# Patient Record
Sex: Female | Born: 1955 | Hispanic: No | Smoking: Current every day smoker
Health system: Southern US, Community
[De-identification: ages and names within clinical notes are randomized; demographics above are authoritative.]

## PROBLEM LIST (undated history)

## (undated) DIAGNOSIS — J45909 Unspecified asthma, uncomplicated: Secondary | ICD-10-CM

## (undated) DIAGNOSIS — S37009A Unspecified injury of unspecified kidney, initial encounter: Secondary | ICD-10-CM

## (undated) DIAGNOSIS — N390 Urinary tract infection, site not specified: Secondary | ICD-10-CM

## (undated) DIAGNOSIS — K219 Gastro-esophageal reflux disease without esophagitis: Secondary | ICD-10-CM

## (undated) DIAGNOSIS — IMO0002 Reserved for concepts with insufficient information to code with codable children: Secondary | ICD-10-CM

## (undated) DIAGNOSIS — E119 Type 2 diabetes mellitus without complications: Secondary | ICD-10-CM

## (undated) DIAGNOSIS — I4891 Unspecified atrial fibrillation: Secondary | ICD-10-CM

## (undated) DIAGNOSIS — N19 Unspecified kidney failure: Secondary | ICD-10-CM

## (undated) DIAGNOSIS — I10 Essential (primary) hypertension: Secondary | ICD-10-CM

## (undated) DIAGNOSIS — E785 Hyperlipidemia, unspecified: Secondary | ICD-10-CM

## (undated) DIAGNOSIS — N3000 Acute cystitis without hematuria: Secondary | ICD-10-CM

## (undated) DIAGNOSIS — R700 Elevated erythrocyte sedimentation rate: Secondary | ICD-10-CM

## (undated) HISTORY — DX: Elevated erythrocyte sedimentation rate: R70.0

## (undated) HISTORY — DX: Acute cystitis without hematuria: N30.00

## (undated) HISTORY — DX: Unspecified injury of unspecified kidney, initial encounter: S37.009A

## (undated) HISTORY — PX: HYSTEROTOMY: SHX1776

## (undated) HISTORY — DX: Urinary tract infection, site not specified: N39.0

## (undated) HISTORY — DX: Gastro-esophageal reflux disease without esophagitis: K21.9

## (undated) HISTORY — PX: OTHER SURGICAL HISTORY: SHX169

## (undated) HISTORY — DX: Type 2 diabetes mellitus without complications: E11.9

## (undated) HISTORY — PX: ABDOMINAL HYSTERECTOMY: SHX81

## (undated) HISTORY — DX: Essential (primary) hypertension: I10

## (undated) HISTORY — DX: Unspecified kidney failure: N19

## (undated) HISTORY — DX: Hyperlipidemia, unspecified: E78.5

---

## 2008-04-06 ENCOUNTER — Ambulatory Visit: Payer: Self-pay

## 2008-04-22 ENCOUNTER — Ambulatory Visit: Payer: Self-pay

## 2009-04-18 DIAGNOSIS — E119 Type 2 diabetes mellitus without complications: Secondary | ICD-10-CM | POA: Insufficient documentation

## 2009-04-18 HISTORY — DX: Type 2 diabetes mellitus without complications: E11.9

## 2009-04-19 DIAGNOSIS — E785 Hyperlipidemia, unspecified: Secondary | ICD-10-CM | POA: Insufficient documentation

## 2009-04-19 HISTORY — DX: Hyperlipidemia, unspecified: E78.5

## 2011-05-08 DIAGNOSIS — E669 Obesity, unspecified: Secondary | ICD-10-CM | POA: Insufficient documentation

## 2011-09-24 ENCOUNTER — Ambulatory Visit: Payer: Self-pay

## 2011-12-05 DIAGNOSIS — Z720 Tobacco use: Secondary | ICD-10-CM | POA: Insufficient documentation

## 2012-03-11 ENCOUNTER — Ambulatory Visit: Payer: Self-pay

## 2012-09-09 ENCOUNTER — Ambulatory Visit: Payer: Self-pay | Admitting: "Endocrinology

## 2012-10-16 ENCOUNTER — Ambulatory Visit: Payer: Self-pay | Admitting: Adult Health

## 2013-08-27 LAB — HM DIABETES EYE EXAM

## 2014-05-06 ENCOUNTER — Other Ambulatory Visit: Payer: Self-pay

## 2014-05-11 ENCOUNTER — Other Ambulatory Visit: Payer: Self-pay

## 2014-05-18 ENCOUNTER — Ambulatory Visit: Payer: Self-pay

## 2014-06-24 ENCOUNTER — Ambulatory Visit: Payer: Self-pay

## 2014-07-13 ENCOUNTER — Other Ambulatory Visit: Payer: Self-pay

## 2014-07-20 ENCOUNTER — Ambulatory Visit: Payer: Self-pay

## 2014-08-05 ENCOUNTER — Emergency Department
Admission: EM | Admit: 2014-08-05 | Discharge: 2014-08-05 | Discharge status: Against Medical Advice | Payer: Self-pay | Attending: Emergency Medicine | Admitting: Emergency Medicine

## 2014-08-05 ENCOUNTER — Encounter: Payer: Self-pay | Admitting: Emergency Medicine

## 2014-08-05 ENCOUNTER — Ambulatory Visit: Payer: Self-pay

## 2014-08-05 DIAGNOSIS — E1165 Type 2 diabetes mellitus with hyperglycemia: Secondary | ICD-10-CM | POA: Insufficient documentation

## 2014-08-05 DIAGNOSIS — Z720 Tobacco use: Secondary | ICD-10-CM | POA: Insufficient documentation

## 2014-08-05 DIAGNOSIS — R6883 Chills (without fever): Secondary | ICD-10-CM | POA: Insufficient documentation

## 2014-08-05 DIAGNOSIS — R1110 Vomiting, unspecified: Secondary | ICD-10-CM | POA: Insufficient documentation

## 2014-08-05 HISTORY — DX: Type 2 diabetes mellitus without complications: E11.9

## 2014-08-05 LAB — CBC
HEMATOCRIT: 43.4 % (ref 35.0–47.0)
HEMOGLOBIN: 14.6 g/dL (ref 12.0–16.0)
MCH: 29.6 pg (ref 26.0–34.0)
MCHC: 33.7 g/dL (ref 32.0–36.0)
MCV: 87.9 fL (ref 80.0–100.0)
PLATELETS: 224 10*3/uL (ref 150–440)
RBC: 4.94 MIL/uL (ref 3.80–5.20)
RDW: 13.9 % (ref 11.5–14.5)
WBC: 15.9 10*3/uL — AB (ref 3.6–11.0)

## 2014-08-05 LAB — COMPREHENSIVE METABOLIC PANEL
ALBUMIN: 4.1 g/dL (ref 3.5–5.0)
ALT: 26 U/L (ref 14–54)
AST: 26 U/L (ref 15–41)
Alkaline Phosphatase: 139 U/L — ABNORMAL HIGH (ref 38–126)
Anion gap: 13 (ref 5–15)
BUN: 14 mg/dL (ref 6–20)
CHLORIDE: 100 mmol/L — AB (ref 101–111)
CO2: 25 mmol/L (ref 22–32)
Calcium: 9.8 mg/dL (ref 8.9–10.3)
Creatinine, Ser: 0.57 mg/dL (ref 0.44–1.00)
GFR calc Af Amer: >60 mL/min (ref >60)
Glucose, Bld: 335 mg/dL — ABNORMAL HIGH (ref 65–99)
POTASSIUM: 4.1 mmol/L (ref 3.5–5.1)
SODIUM: 138 mmol/L (ref 135–145)
TOTAL PROTEIN: 7.8 g/dL (ref 6.5–8.1)
Total Bilirubin: 0.6 mg/dL (ref 0.3–1.2)

## 2014-08-05 NOTE — ED Notes (Signed)
Pt woke up this am with chills, states she feels like she is going to faint, has been vomiting today and her blood glucose has been running high.

## 2014-08-24 ENCOUNTER — Ambulatory Visit: Payer: Self-pay

## 2014-08-26 ENCOUNTER — Ambulatory Visit: Payer: Self-pay | Admitting: Ophthalmology

## 2014-08-26 LAB — HM DIABETES EYE EXAM

## 2014-09-19 DIAGNOSIS — E871 Hypo-osmolality and hyponatremia: Secondary | ICD-10-CM | POA: Insufficient documentation

## 2014-09-19 DIAGNOSIS — S37009A Unspecified injury of unspecified kidney, initial encounter: Secondary | ICD-10-CM

## 2014-09-19 DIAGNOSIS — D72829 Elevated white blood cell count, unspecified: Secondary | ICD-10-CM | POA: Insufficient documentation

## 2014-09-19 DIAGNOSIS — N3000 Acute cystitis without hematuria: Secondary | ICD-10-CM | POA: Insufficient documentation

## 2014-09-19 DIAGNOSIS — N19 Unspecified kidney failure: Secondary | ICD-10-CM

## 2014-09-19 DIAGNOSIS — N390 Urinary tract infection, site not specified: Secondary | ICD-10-CM

## 2014-09-19 HISTORY — DX: Urinary tract infection, site not specified: N39.0

## 2014-09-19 HISTORY — DX: Acute cystitis without hematuria: N30.00

## 2014-09-19 HISTORY — DX: Unspecified injury of unspecified kidney, initial encounter: S37.009A

## 2014-09-19 HISTORY — DX: Unspecified kidney failure: N19

## 2014-09-22 DIAGNOSIS — R700 Elevated erythrocyte sedimentation rate: Secondary | ICD-10-CM

## 2014-09-22 HISTORY — DX: Elevated erythrocyte sedimentation rate: R70.0

## 2014-10-14 ENCOUNTER — Other Ambulatory Visit: Payer: Self-pay

## 2014-10-19 ENCOUNTER — Ambulatory Visit: Payer: Self-pay

## 2014-10-20 ENCOUNTER — Ambulatory Visit: Payer: Self-pay

## 2014-10-26 ENCOUNTER — Ambulatory Visit: Payer: Self-pay

## 2014-10-28 ENCOUNTER — Ambulatory Visit: Payer: Self-pay | Admitting: Ophthalmology

## 2014-10-28 LAB — HM DIABETES EYE EXAM

## 2014-11-01 ENCOUNTER — Encounter: Payer: Self-pay | Admitting: Urology

## 2014-11-01 ENCOUNTER — Ambulatory Visit
Admission: RE | Admit: 2014-11-01 | Discharge: 2014-11-01 | Disposition: A | Discharge status: Home / Self Care | Payer: Self-pay | Source: Ambulatory Visit | Attending: Urology | Admitting: Urology

## 2014-11-01 ENCOUNTER — Ambulatory Visit (INDEPENDENT_AMBULATORY_CARE_PROVIDER_SITE_OTHER): Payer: Self-pay | Admitting: Urology

## 2014-11-01 VITALS — BP 124/80 | HR 80 | Ht 64.0 in | Wt 224.9 lb

## 2014-11-01 DIAGNOSIS — Z7901 Long term (current) use of anticoagulants: Secondary | ICD-10-CM

## 2014-11-01 DIAGNOSIS — I4891 Unspecified atrial fibrillation: Secondary | ICD-10-CM

## 2014-11-01 DIAGNOSIS — N200 Calculus of kidney: Secondary | ICD-10-CM

## 2014-11-01 DIAGNOSIS — D72829 Elevated white blood cell count, unspecified: Secondary | ICD-10-CM

## 2014-11-01 DIAGNOSIS — Z960 Presence of urogenital implants: Secondary | ICD-10-CM

## 2014-11-01 NOTE — Progress Notes (Signed)
11/01/2014 10:12 AM   Rose Luna 04/14/1955 [id number]  Referring provider: No referring provider defined for this encounter.  Chief Complaint  Patient presents with  . Nephrolithiasis    new pt, Pt was seen in out of town in the ER diagnose w/ kidney stones     HPI: Patient is a 59 year old white female who underwent emergent placement of a left ureteral stent in [place] on 09/19/2014 for an obstructing left 5 mm stone with sepsis.  She presents today to discuss further management.    Patient was seen and admitted to [facility] in [place] on 09/19/2014 for abdominal pain, diarrhea and generalized weakness.  She was found to have atrial fibrillation with RVR, leukocytosis and an obstructing 5 mm diameter calculus at the left UPJ by CT scan. Urology was consulted. Her urinalysis at that time noted greater than 50 wbc's per high-powered field and her creatinine was 4.35.  It was decided at that time to do an emergent left ureteral stent placement.  She was transferred to the ICU and then to the med floor.  Her urine culture was positive for E. Coli and she was discharged on Septra DS and instructed to follow up with urology for further management.    She also was started on amiodarone and coumadin during that admission and was to continue the medication upon discharge.  She did not continue either of these medications.  She has been without them for one week.    Today, she states she is feeling well.  She has restarted her lisinopril and metformin.  She is having some mild left flank pain.  She is not having any difficulty urinating.  She is not having fever, chills, nausea or vomiting.  Her creatinine is now 0.99.  KUB taken today notes the stent is in good position and the 7 mm stone is in the lower pole of the left kidney.    PMH: Past Medical History  Diagnosis Date  . Diabetes mellitus without complication (HCC)   . Type 2 diabetes mellitus (HCC) 04/18/2009  . Type 2  diabetes mellitus (HCC) 04/18/2009  . Acute cystitis 09/19/2014  . Injury of kidney 09/19/2014  . Uremia 09/19/2014  . Infection of urinary tract 09/19/2014  . Elevated erythrocyte sedimentation rate 09/22/2014  . HLD (hyperlipidemia) 04/19/2009    Surgical History: Past Surgical History  Procedure Laterality Date  . Cesarean section    . Urethral stent      Home Medications:    Medication List       This list is accurate as of: 11/01/14 11:59 PM.  Always use your most recent med list.               ASPIR-81 81 MG EC tablet  Generic drug:  aspirin  Take by mouth.     atorvastatin 40 MG tablet  Commonly known as:  LIPITOR  Take by mouth.     FREESTYLE LITE test strip  Generic drug:  glucose blood  Two (2) times a day.     gabapentin 300 MG capsule  Commonly known as:  NEURONTIN  Take by mouth.     gemfibrozil 600 MG tablet  Commonly known as:  LOPID  Take by mouth.     insulin detemir 100 UNIT/ML injection  Commonly known as:  LEVEMIR  Inject into the skin.     metFORMIN 1000 MG tablet  Commonly known as:  GLUCOPHAGE  Take by mouth.  ranitidine 150 MG capsule  Commonly known as:  ZANTAC  Take by mouth.     VENTOLIN HFA 108 (90 BASE) MCG/ACT inhaler  Generic drug:  albuterol  Inhale into the lungs.        Allergies: No Known Allergies  Family History: Family History  Problem Relation Age of Onset  . Adopted: Yes    Social History:  reports that she has been smoking Cigarettes.  She has been smoking about 1.00 pack per day. She does not have any smokeless tobacco history on file. She reports that she does not drink alcohol. Her drug history is not on file.  ROS: UROLOGY Frequent Urination?: No Hard to postpone urination?: Yes Burning/pain with urination?: No Get up at night to urinate?: Yes Leakage of urine?: Yes Urine stream starts and stops?: Yes Trouble starting stream?: No Do you have to strain to urinate?: No Blood in urine?:  Yes Urinary tract infection?: No Sexually transmitted disease?: No Injury to kidneys or bladder?: No Painful intercourse?: No Weak stream?: No Currently pregnant?: No Vaginal bleeding?: No Last menstrual period?: No  Gastrointestinal Nausea?: No Vomiting?: No Indigestion/heartburn?: No Diarrhea?: No Constipation?: No  Constitutional Fever: No Night sweats?: No Weight loss?: No Fatigue?: No  Skin Skin rash/lesions?: No Itching?: No  Eyes Blurred vision?: No Double vision?: No  Ears/Nose/Throat Sore throat?: No Sinus problems?: No  Hematologic/Lymphatic Swollen glands?: No Easy bruising?: No  Cardiovascular Leg swelling?: No Chest pain?: No  Respiratory Cough?: No Shortness of breath?: No  Endocrine Excessive thirst?: No  Musculoskeletal Back pain?: Yes Joint pain?: No  Neurological Headaches?: No Dizziness?: No  Psychologic Depression?: No Anxiety?: No  Physical Exam: BP 124/80 mmHg  Pulse 80  Ht  (1.626 m)  Wt 224 lb 14.4 oz (102.014 kg)  BMI 38.59 kg/m2  Constitutional: Well nourished. Alert and oriented, No acute distress. HEENT: [place] AT, moist mucus membranes. Trachea midline, no masses. Cardiovascular: No clubbing, cyanosis, or edema. Respiratory: Normal respiratory effort, no increased work of breathing. GI: Abdomen is soft, non tender, non distended, no abdominal masses. Liver and spleen not palpable.  No hernias appreciated.  Stool sample for occult testing is not indicated.   GU: No CVA tenderness.  No bladder fullness or masses.   Skin: No rashes, bruises or suspicious lesions. Lymph: No cervical or inguinal adenopathy. Neurologic: Grossly intact, no focal deficits, moving all 4 extremities. Psychiatric: Normal mood and affect.  Laboratory Data: Lab Results  Component Value Date   WBC 13.5* 11/01/2014   HGB 14.6 08/05/2014   HCT 32.9* 11/01/2014   MCV 87.9 08/05/2014   PLT 224 08/05/2014    Lab Results  Component  Value Date   CREATININE 0.99 11/01/2014    Urinalysis: Results for orders placed or performed in visit on 11/01/14  Protime-INR  Result Value Ref Range   INR 1.0 0.8 - 1.2   Prothrombin Time 10.2 9.1 - 12.0 sec  Basic metabolic panel  Result Value Ref Range   Glucose 139 (H) 65 - 99 mg/dL   BUN 26 (H) 6 - 24 mg/dL   Creatinine, Ser 2.13 0.57 - 1.00 mg/dL   GFR calc non Af Amer 64 >59 mL/min/1.73   GFR calc Af Amer 74 >59 mL/min/1.73   BUN/Creatinine Ratio 26 (H) 9 - 23   Sodium 139 136 - 144 mmol/L   Potassium 4.6 3.5 - 5.2 mmol/L   Chloride 97 97 - 106 mmol/L   CO2 19 18 - 29 mmol/L  Calcium 10.1 8.7 - 10.2 mg/dL  CBC with Differential/Platelet  Result Value Ref Range   WBC 13.5 (H) 3.4 - 10.8 x10E3/uL   RBC 3.86 3.77 - 5.28 x10E6/uL   Hemoglobin 11.0 (L) 11.1 - 15.9 g/dL   Hematocrit 16.1 (L) 09.6 - 46.6 %   MCV 85 79 - 97 fL   MCH 28.5 26.6 - 33.0 pg   MCHC 33.4 31.5 - 35.7 g/dL   RDW 04.5 (H) 40.9 - 81.1 %   Platelets 433 (H) 150 - 379 x10E3/uL   Neutrophils 48 %   Lymphs 35 %   Monocytes 9 %   Eos 8 %   Basos 0 %   Neutrophils Absolute 6.5 1.4 - 7.0 x10E3/uL   Lymphocytes Absolute 4.7 (H) 0.7 - 3.1 x10E3/uL   Monocytes Absolute 1.2 (H) 0.1 - 0.9 x10E3/uL   EOS (ABSOLUTE) 1.0 (H) 0.0 - 0.4 x10E3/uL   Basophils Absolute 0.1 0.0 - 0.2 x10E3/uL   Immature Granulocytes 0 %   Immature Grans (Abs) 0.0 0.0 - 0.1 x10E3/uL   Pertinent Imaging: CLINICAL DATA: Left ureteral obstruction, left ureteral stent placement.  EXAM: ABDOMEN - 1 VIEW  COMPARISON: None.  FINDINGS: Left ureteral stent is in place. 7 mm stone projects over the lower pole of the left kidney. No visible calcifications along the course of the left ureteral stent. Calcified phleboliths in the anatomic pelvis.  Nonobstructive bowel gas pattern. No organomegaly or supine evidence of free air.  IMPRESSION: Left ureteral stent in place.  Left lower pole  nephrolithiasis.   Electronically Signed  By: Charlett Nose M.D.  On: 11/02/2014 07:54  Assessment & Plan:    1. S/P left ureteral stent placement:   Patient's stent is in good position. The stone appears to have moved from the left UPJ to the left lower pole of the kidney.  2. Left lower pole stone:   Patient's ureteral stent and stone will need to be addressed. But, she has a complicated cardiac history and will need cardiac clearance prior to [place] addressing the stone.  3. Atrial fibrillation:   Patient was diagnosed with atrial fibrillation during her admission in [place]. She was started on Coumadin and amiodarone, but she has not continued these medications.  We have referred her to cardiology, but they are unable to see her until January 3rd.  I was instructed to send her to the emergency room if I needed an earlier cardiac consultation by [facility].   It is not safe to have a temporary ureteral stent for an extended period of time due to the risk of encrustation of the stent.  We would like to address the stone/stent soon as possible, to prevent a serious complication.    - Basic metabolic panel - CBC with Differential/Platelet   Return for To ED.  Michiel Cowboy, PA-C  [facility] [address] [place], [place] [address] [phone]

## 2014-11-02 ENCOUNTER — Telehealth: Payer: Self-pay

## 2014-11-02 DIAGNOSIS — K30 Functional dyspepsia: Secondary | ICD-10-CM

## 2014-11-02 LAB — BASIC METABOLIC PANEL
BUN / CREAT RATIO: 26 — AB (ref 9–23)
BUN: 26 mg/dL — AB (ref 6–24)
CHLORIDE: 97 mmol/L (ref 97–106)
CO2: 19 mmol/L (ref 18–29)
Calcium: 10.1 mg/dL (ref 8.7–10.2)
Creatinine, Ser: 0.99 mg/dL (ref 0.57–1.00)
GFR calc Af Amer: 74 mL/min/{1.73_m2} (ref >59)
GFR calc non Af Amer: 64 mL/min/{1.73_m2} (ref >59)
Glucose: 139 mg/dL — ABNORMAL HIGH (ref 65–99)
Potassium: 4.6 mmol/L (ref 3.5–5.2)
Sodium: 139 mmol/L (ref 136–144)

## 2014-11-02 LAB — CBC WITH DIFFERENTIAL/PLATELET
BASOS ABS: 0.1 10*3/uL (ref 0.0–0.2)
Basos: 0 %
EOS (ABSOLUTE): 1 10*3/uL — ABNORMAL HIGH (ref 0.0–0.4)
Eos: 8 %
Hematocrit: 32.9 % — ABNORMAL LOW (ref 34.0–46.6)
Hemoglobin: 11 g/dL — ABNORMAL LOW (ref 11.1–15.9)
IMMATURE GRANULOCYTES: 0 %
Immature Grans (Abs): 0 10*3/uL (ref 0.0–0.1)
LYMPHS ABS: 4.7 10*3/uL — AB (ref 0.7–3.1)
Lymphs: 35 %
MCH: 28.5 pg (ref 26.6–33.0)
MCHC: 33.4 g/dL (ref 31.5–35.7)
MCV: 85 fL (ref 79–97)
MONOS ABS: 1.2 10*3/uL — AB (ref 0.1–0.9)
Monocytes: 9 %
NEUTROS PCT: 48 %
Neutrophils Absolute: 6.5 10*3/uL (ref 1.4–7.0)
Platelets: 433 10*3/uL — ABNORMAL HIGH (ref 150–379)
RBC: 3.86 x10E6/uL (ref 3.77–5.28)
RDW: 15.6 % — AB (ref 12.3–15.4)
WBC: 13.5 10*3/uL — ABNORMAL HIGH (ref 3.4–10.8)

## 2014-11-02 LAB — PROTIME-INR
INR: 1 (ref 0.8–1.2)
PROTHROMBIN TIME: 10.2 s (ref 9.1–12.0)

## 2014-11-02 NOTE — Telephone Encounter (Signed)
Okay to refill.  Ranitidine 150 mg bid # 60.

## 2014-11-02 NOTE — Telephone Encounter (Signed)
Pt pharmacy sent a refill request for ranitine 150mg . Please advise.

## 2014-11-03 ENCOUNTER — Encounter: Payer: Self-pay | Admitting: Emergency Medicine

## 2014-11-03 ENCOUNTER — Other Ambulatory Visit: Payer: Self-pay

## 2014-11-03 ENCOUNTER — Emergency Department: Payer: Self-pay

## 2014-11-03 ENCOUNTER — Inpatient Hospital Stay
Admission: EM | Admit: 2014-11-03 | Discharge: 2014-11-05 | DRG: 690 | Disposition: A | Discharge status: Home / Self Care | Payer: Self-pay | Attending: Internal Medicine | Admitting: Internal Medicine

## 2014-11-03 DIAGNOSIS — N151 Renal and perinephric abscess: Secondary | ICD-10-CM | POA: Diagnosis present

## 2014-11-03 DIAGNOSIS — Z7982 Long term (current) use of aspirin: Secondary | ICD-10-CM

## 2014-11-03 DIAGNOSIS — F17210 Nicotine dependence, cigarettes, uncomplicated: Secondary | ICD-10-CM | POA: Diagnosis present

## 2014-11-03 DIAGNOSIS — E785 Hyperlipidemia, unspecified: Secondary | ICD-10-CM | POA: Diagnosis present

## 2014-11-03 DIAGNOSIS — N12 Tubulo-interstitial nephritis, not specified as acute or chronic: Secondary | ICD-10-CM

## 2014-11-03 DIAGNOSIS — Z7984 Long term (current) use of oral hypoglycemic drugs: Secondary | ICD-10-CM

## 2014-11-03 DIAGNOSIS — F17200 Nicotine dependence, unspecified, uncomplicated: Secondary | ICD-10-CM | POA: Diagnosis present

## 2014-11-03 DIAGNOSIS — E669 Obesity, unspecified: Secondary | ICD-10-CM | POA: Diagnosis present

## 2014-11-03 DIAGNOSIS — Z6838 Body mass index (BMI) 38.0-38.9, adult: Secondary | ICD-10-CM

## 2014-11-03 DIAGNOSIS — Z794 Long term (current) use of insulin: Secondary | ICD-10-CM

## 2014-11-03 DIAGNOSIS — Z79899 Other long term (current) drug therapy: Secondary | ICD-10-CM

## 2014-11-03 DIAGNOSIS — I4891 Unspecified atrial fibrillation: Secondary | ICD-10-CM | POA: Diagnosis present

## 2014-11-03 DIAGNOSIS — I071 Rheumatic tricuspid insufficiency: Secondary | ICD-10-CM | POA: Diagnosis present

## 2014-11-03 DIAGNOSIS — Z960 Presence of urogenital implants: Secondary | ICD-10-CM | POA: Insufficient documentation

## 2014-11-03 DIAGNOSIS — N136 Pyonephrosis: Principal | ICD-10-CM | POA: Diagnosis present

## 2014-11-03 DIAGNOSIS — Z1611 Resistance to penicillins: Secondary | ICD-10-CM | POA: Diagnosis present

## 2014-11-03 HISTORY — DX: Reserved for concepts with insufficient information to code with codable children: IMO0002

## 2014-11-03 HISTORY — DX: Unspecified asthma, uncomplicated: J45.909

## 2014-11-03 HISTORY — DX: Unspecified atrial fibrillation: I48.91

## 2014-11-03 LAB — CBC WITH DIFFERENTIAL/PLATELET
BASOS ABS: 0.1 10*3/uL (ref 0–0.1)
Basophils Relative: 1 %
Eosinophils Absolute: 1.1 10*3/uL — ABNORMAL HIGH (ref 0–0.7)
Eosinophils Relative: 8 %
HCT: 33.9 % — ABNORMAL LOW (ref 35.0–47.0)
Hemoglobin: 11.2 g/dL — ABNORMAL LOW (ref 12.0–16.0)
LYMPHS PCT: 34 %
Lymphs Abs: 4.8 10*3/uL — ABNORMAL HIGH (ref 1.0–3.6)
MCH: 28.5 pg (ref 26.0–34.0)
MCHC: 33.1 g/dL (ref 32.0–36.0)
MCV: 86.2 fL (ref 80.0–100.0)
MONO ABS: 1.5 10*3/uL — AB (ref 0.2–0.9)
Monocytes Relative: 10 %
Neutro Abs: 6.8 10*3/uL — ABNORMAL HIGH (ref 1.4–6.5)
Neutrophils Relative %: 47 %
PLATELETS: 391 10*3/uL (ref 150–440)
RBC: 3.93 MIL/uL (ref 3.80–5.20)
RDW: 16 % — AB (ref 11.5–14.5)
WBC: 14.3 10*3/uL — AB (ref 3.6–11.0)

## 2014-11-03 LAB — URINALYSIS COMPLETE WITH MICROSCOPIC (ARMC ONLY)
Bilirubin Urine: NEGATIVE
GLUCOSE, UA: 50 mg/dL — AB
Ketones, ur: NEGATIVE mg/dL
NITRITE: POSITIVE — AB
Protein, ur: 100 mg/dL — AB
Specific Gravity, Urine: 1.014 (ref 1.005–1.030)
pH: 6 (ref 5.0–8.0)

## 2014-11-03 LAB — BASIC METABOLIC PANEL
ANION GAP: 12 (ref 5–15)
BUN: 25 mg/dL — ABNORMAL HIGH (ref 6–20)
CALCIUM: 10.1 mg/dL (ref 8.9–10.3)
CO2: 23 mmol/L (ref 22–32)
Chloride: 102 mmol/L (ref 101–111)
Creatinine, Ser: 0.92 mg/dL (ref 0.44–1.00)
GFR calc Af Amer: >60 mL/min (ref >60)
GFR calc non Af Amer: >60 mL/min (ref >60)
Glucose, Bld: 186 mg/dL — ABNORMAL HIGH (ref 65–99)
POTASSIUM: 4.1 mmol/L (ref 3.5–5.1)
SODIUM: 137 mmol/L (ref 135–145)

## 2014-11-03 LAB — GLUCOSE, CAPILLARY
GLUCOSE-CAPILLARY: 157 mg/dL — AB (ref 65–99)
Glucose-Capillary: 260 mg/dL — ABNORMAL HIGH (ref 65–99)

## 2014-11-03 LAB — PROTIME-INR
INR: 1.01
Prothrombin Time: 13.5 seconds (ref 11.4–15.0)

## 2014-11-03 MED ORDER — SODIUM CHLORIDE 0.9 % IV SOLN
INTRAVENOUS | Status: DC
  Administered 2014-11-03 – 2014-11-05 (×4): via INTRAVENOUS

## 2014-11-03 MED ORDER — IOHEXOL 240 MG/ML SOLN
25.0000 mL | Freq: Once | INTRAMUSCULAR | Status: AC | PRN
  Administered 2014-11-03: 25 mL via ORAL
  Filled 2014-11-03: qty 25

## 2014-11-03 MED ORDER — FLUTICASONE FUROATE 27.5 MCG/SPRAY NA SUSP: 1.0000 | Freq: Every day | NASAL | Status: DC

## 2014-11-03 MED ORDER — METFORMIN HCL 500 MG PO TABS
1000.0000 mg | ORAL_TABLET | Freq: Two times a day (BID) | ORAL | Status: DC
  Administered 2014-11-04 – 2014-11-05 (×3): 1000 mg via ORAL
  Filled 2014-11-03 (×4): qty 2

## 2014-11-03 MED ORDER — HYDROCODONE-ACETAMINOPHEN 5-325 MG PO TABS: 1.0000 | ORAL_TABLET | ORAL | Status: DC | PRN

## 2014-11-03 MED ORDER — PIPERACILLIN-TAZOBACTAM 3.375 G IVPB
INTRAVENOUS | Status: AC
  Administered 2014-11-03: 3.375 g via INTRAVENOUS
  Filled 2014-11-03: qty 50

## 2014-11-03 MED ORDER — ATORVASTATIN CALCIUM 40 MG PO TABS
40.0000 mg | ORAL_TABLET | Freq: Every day | ORAL | Status: DC
  Administered 2014-11-03 – 2014-11-04 (×2): 40 mg via ORAL
  Filled 2014-11-03 (×4): qty 1

## 2014-11-03 MED ORDER — BUDESONIDE 0.25 MG/2ML IN SUSP
0.2500 mg | Freq: Two times a day (BID) | RESPIRATORY_TRACT | Status: DC
  Administered 2014-11-04: 0.25 mg via RESPIRATORY_TRACT
  Filled 2014-11-03: qty 2

## 2014-11-03 MED ORDER — PIPERACILLIN-TAZOBACTAM 4.5 G IVPB
4.5000 g | Freq: Three times a day (TID) | INTRAVENOUS | Status: DC
  Administered 2014-11-04: 05:00:00 4.5 g via INTRAVENOUS
  Filled 2014-11-03 (×3): qty 100

## 2014-11-03 MED ORDER — GABAPENTIN 300 MG PO CAPS
900.0000 mg | ORAL_CAPSULE | Freq: Every day | ORAL | Status: DC
  Administered 2014-11-03 – 2014-11-04 (×2): 900 mg via ORAL
  Filled 2014-11-03 (×2): qty 3

## 2014-11-03 MED ORDER — FLUTICASONE PROPIONATE 50 MCG/ACT NA SUSP
1.0000 | Freq: Every day | NASAL | Status: DC
  Administered 2014-11-04 – 2014-11-05 (×2): 1 via NASAL
  Filled 2014-11-03: qty 16

## 2014-11-03 MED ORDER — GEMFIBROZIL 600 MG PO TABS
600.0000 mg | ORAL_TABLET | Freq: Two times a day (BID) | ORAL | Status: DC
  Administered 2014-11-03 – 2014-11-05 (×4): 600 mg via ORAL
  Filled 2014-11-03 (×5): qty 1

## 2014-11-03 MED ORDER — ACETAMINOPHEN 325 MG PO TABS: 650.0000 mg | ORAL_TABLET | Freq: Four times a day (QID) | ORAL | Status: DC | PRN

## 2014-11-03 MED ORDER — ACETAMINOPHEN 650 MG RE SUPP
650.0000 mg | Freq: Four times a day (QID) | RECTAL | Status: DC | PRN
  Filled 2014-11-03: qty 1

## 2014-11-03 MED ORDER — NICOTINE 21 MG/24HR TD PT24
21.0000 mg | MEDICATED_PATCH | Freq: Every day | TRANSDERMAL | Status: DC
  Administered 2014-11-03 – 2014-11-05 (×3): 21 mg via TRANSDERMAL
  Filled 2014-11-03 (×3): qty 1

## 2014-11-03 MED ORDER — INSULIN DETEMIR 100 UNIT/ML ~~LOC~~ SOLN
40.0000 [IU] | Freq: Two times a day (BID) | SUBCUTANEOUS | Status: DC
  Administered 2014-11-03 – 2014-11-05 (×4): 40 [IU] via SUBCUTANEOUS
  Filled 2014-11-03 (×5): qty 0.4

## 2014-11-03 MED ORDER — FAMOTIDINE 20 MG PO TABS
20.0000 mg | ORAL_TABLET | Freq: Two times a day (BID) | ORAL | Status: DC
  Administered 2014-11-03 – 2014-11-05 (×4): 20 mg via ORAL
  Filled 2014-11-03 (×4): qty 1

## 2014-11-03 MED ORDER — PIPERACILLIN-TAZOBACTAM 3.375 G IVPB
3.3750 g | Freq: Once | INTRAVENOUS | Status: AC
  Administered 2014-11-03: 3.375 g via INTRAVENOUS

## 2014-11-03 MED ORDER — PNEUMOCOCCAL VAC POLYVALENT 25 MCG/0.5ML IJ INJ
0.5000 mL | INJECTION | INTRAMUSCULAR | Status: AC
  Administered 2014-11-05: 10:00:00 0.5 mL via INTRAMUSCULAR
  Filled 2014-11-03: qty 0.5

## 2014-11-03 MED ORDER — FLUTICASONE PROPIONATE HFA 110 MCG/ACT IN AERO: 1.0000 | INHALATION_SPRAY | Freq: Two times a day (BID) | RESPIRATORY_TRACT | Status: DC | PRN

## 2014-11-03 MED ORDER — PIPERACILLIN-TAZOBACTAM 3.375 G IVPB: 3.3750 g | Freq: Three times a day (TID) | INTRAVENOUS | Status: DC

## 2014-11-03 MED ORDER — SODIUM CHLORIDE 0.9 % IJ SOLN
3.0000 mL | Freq: Two times a day (BID) | INTRAMUSCULAR | Status: DC
  Administered 2014-11-03: 3 mL via INTRAVENOUS

## 2014-11-03 MED ORDER — RANITIDINE HCL 150 MG PO CAPS: 150.0000 mg | ORAL_CAPSULE | Freq: Two times a day (BID) | ORAL | Status: DC

## 2014-11-03 MED ORDER — ONDANSETRON HCL 4 MG PO TABS: 4.0000 mg | ORAL_TABLET | Freq: Four times a day (QID) | ORAL | Status: DC | PRN

## 2014-11-03 MED ORDER — IOHEXOL 350 MG/ML SOLN
100.0000 mL | Freq: Once | INTRAVENOUS | Status: AC | PRN
  Administered 2014-11-03: 100 mL via INTRAVENOUS
  Filled 2014-11-03: qty 100

## 2014-11-03 MED ORDER — INSULIN ASPART 100 UNIT/ML ~~LOC~~ SOLN
0.0000 [IU] | Freq: Three times a day (TID) | SUBCUTANEOUS | Status: DC
  Administered 2014-11-04: 12:00:00 3 [IU] via SUBCUTANEOUS
  Administered 2014-11-04: 18:00:00 1 [IU] via SUBCUTANEOUS
  Administered 2014-11-04: 7 [IU] via SUBCUTANEOUS
  Administered 2014-11-05: 3 [IU] via SUBCUTANEOUS
  Filled 2014-11-03 (×2): qty 3
  Filled 2014-11-03: qty 1
  Filled 2014-11-03: qty 7

## 2014-11-03 MED ORDER — PIPERACILLIN-TAZOBACTAM 4.5 G IVPB
4.5000 g | Freq: Three times a day (TID) | INTRAVENOUS | Status: DC
  Filled 2014-11-03 (×2): qty 100

## 2014-11-03 MED ORDER — ONDANSETRON HCL 4 MG/2ML IJ SOLN: 4.0000 mg | Freq: Four times a day (QID) | INTRAMUSCULAR | Status: DC | PRN

## 2014-11-03 NOTE — ED Provider Notes (Signed)
[facility] Emergency Department Provider Note  ____________________________________________  Time seen: 3:50 PM  I have reviewed the triage vital signs and the nursing notes.   HISTORY  Chief Complaint Palpitations    HPI Rose Luna is a 59 y.o. female who complains of occasional right upper quadrant abdominal pain as well as some left-sided abdominal pain today. She has a complicated history of recently being admitted to the hospital in [place] for septic shock in the setting of urinary tract infection with obstruction of the left ureter. A ureteral stent was placed and after an ICU stay, the patient improved and was discharged home. Her course was complicated by atrial fibrillation with rapid ventricular response requiring amiodarone for management.   The patient was given amiodarone and warfarin when she was discharged from the [facility], but she did not fill them and has not been taking them. Denies any Chest pain shortness of breath or palpitations since then. No dizziness or syncope.  On follow-up today in urology clinic, a KUB showed that the patient has a 7 mm stone at the lower pole of the left kidney. The temporary ureteral stent is in position in the left ureter. Patient denies dysuria frequency urgency but does state that she drinks very little water and urinates only once or twice a day typically.     Past Medical History  Diagnosis Date  . Diabetes mellitus without complication (HCC)   . Type 2 diabetes mellitus (HCC) 04/18/2009  . Type 2 diabetes mellitus (HCC) 04/18/2009  . Acute cystitis 09/19/2014  . Injury of kidney 09/19/2014  . Uremia 09/19/2014  . Infection of urinary tract 09/19/2014  . Elevated erythrocyte sedimentation rate 09/22/2014  . HLD (hyperlipidemia) 04/19/2009  . Asthma      Patient Active Problem List   Diagnosis Date Noted  . Retained ureteral stent 11/03/2014  . Elevated erythrocyte sedimentation rate  09/22/2014  . Acute cystitis 09/19/2014  . Injury of kidney 09/19/2014  . Hypomagnesemia 09/19/2014  . Below normal amount of sodium in the blood 09/19/2014  . Elevated WBC count 09/19/2014  . Uremia 09/19/2014  . Infection of urinary tract 09/19/2014  . Current tobacco use 12/05/2011  . Adiposity 05/08/2011  . HLD (hyperlipidemia) 04/19/2009  . Type 2 diabetes mellitus (HCC) 04/18/2009     Past Surgical History  Procedure Laterality Date  . Cesarean section    . Urethral stent       Current Outpatient Rx  Name  Route  Sig  Dispense  Refill  . albuterol (VENTOLIN HFA) 108 (90 BASE) MCG/ACT inhaler   Inhalation   Inhale into the lungs.         Marland Kitchen aspirin (ASPIR-81) 81 MG EC tablet   Oral   Take by mouth.         Marland Kitchen atorvastatin (LIPITOR) 40 MG tablet   Oral   Take by mouth.         . gabapentin (NEURONTIN) 300 MG capsule   Oral   Take by mouth.         Marland Kitchen gemfibrozil (LOPID) 600 MG tablet   Oral   Take by mouth.         Marland Kitchen glucose blood (FREESTYLE LITE) test strip      Two (2) times a day.         . insulin detemir (LEVEMIR) 100 UNIT/ML injection   Subcutaneous   Inject into the skin.         Marland Kitchen  metFORMIN (GLUCOPHAGE) 1000 MG tablet   Oral   Take by mouth.         . ranitidine (ZANTAC) 150 MG capsule   Oral   Take 1 capsule (150 mg total) by mouth 2 (two) times daily.   60 capsule   3      Allergies Review of patient's allergies indicates no known allergies.   Family History  Problem Relation Age of Onset  . Adopted: Yes    Social History Social History  Substance Use Topics  . Smoking status: Current Every Day Smoker -- 1.00 packs/day    Types: Cigarettes  . Smokeless tobacco: None  . Alcohol Use: No    Review of Systems  Constitutional:   No fever or chills. No weight changes Eyes:   No blurry vision or double vision.  ENT:   No sore throat. Cardiovascular:   No chest pain. Respiratory:   No dyspnea or  cough. Gastrointestinal:   Positive for abdominal pain, without vomiting and diarrhea.  No BRBPR or melena. Genitourinary:   Negative for dysuria, urinary retention, bloody urine, or difficulty urinating. Musculoskeletal:   Negative for back pain. No joint swelling or pain. Skin:   Negative for rash. Neurological:   Negative for headaches, focal weakness or numbness. Psychiatric:  No anxiety or depression.   Endocrine:  No hot/cold intolerance, changes in energy, or sleep difficulty.  10-point ROS otherwise negative.  ____________________________________________   PHYSICAL EXAM:  VITAL SIGNS: ED Triage Vitals  Enc Vitals Group     BP 11/03/14 1404 132/66 mmHg     Pulse Rate 11/03/14 1404 82     Resp --      Temp 11/03/14 1404 98.6 F (37 C)     Temp Source 11/03/14 1404 Oral     SpO2 11/03/14 1404 95 %     Weight 11/03/14 1404 224 lb (101.606 kg)     Height 11/03/14 1404  (1.626 m)     Head Cir --      Peak Flow --      Pain Score --      Pain Loc --      Pain Edu? --      Excl. in GC? --      Constitutional:   Alert and oriented. Well appearing and in no distress. Eyes:   No scleral icterus. No conjunctival pallor. PERRL. EOMI ENT   Head:   Normocephalic and atraumatic.   Nose:   No congestion/rhinnorhea. No septal hematoma   Mouth/Throat:   MMM, no pharyngeal erythema. No peritonsillar mass. No uvula shift.   Neck:   No stridor. No SubQ emphysema. No meningismus. Hematological/Lymphatic/Immunilogical:   No cervical lymphadenopathy. Cardiovascular:   RRR. Normal and symmetric distal pulses are present in all extremities. No murmurs, rubs, or gallops. Respiratory:   Normal respiratory effort without tachypnea nor retractions. Breath sounds are clear and equal bilaterally. No wheezes/rales/rhonchi. Gastrointestinal:   Moderate right upper quadrant and severe left mid and lower quadrant tenderness. Positive suprapubic tenderness as well. No rebound  rigidity or guarding. No distention. There is no CVA tenderness.   Genitourinary:   deferred Musculoskeletal:   Nontender with normal range of motion in all extremities. No joint effusions.  No lower extremity tenderness.  No edema. Neurologic:   Normal speech and language.  CN 2-10 normal. Motor grossly intact. No pronator drift.  Normal gait. No gross focal neurologic deficits are appreciated.  Skin:    Skin is warm, dry  and intact. No rash noted.  No petechiae, purpura, or bullae. Psychiatric:   Mood and affect are normal. Speech and behavior are normal. Patient exhibits appropriate insight and judgment.  ____________________________________________    LABS (pertinent positives/negatives) (all labs ordered are listed, but only abnormal results are displayed) Labs Reviewed  BASIC METABOLIC PANEL - Abnormal; Notable for the following:    Glucose, Bld 186 (*)    BUN 25 (*)    All other components within normal limits  CBC WITH DIFFERENTIAL/PLATELET - Abnormal; Notable for the following:    WBC 14.3 (*)    Hemoglobin 11.2 (*)    HCT 33.9 (*)    RDW 16.0 (*)    Neutro Abs 6.8 (*)    Lymphs Abs 4.8 (*)    Monocytes Absolute 1.5 (*)    Eosinophils Absolute 1.1 (*)    All other components within normal limits  URINALYSIS COMPLETEWITH MICROSCOPIC ([facility] ONLY) - Abnormal; Notable for the following:    Color, Urine YELLOW (*)    APPearance CLOUDY (*)    Glucose, UA 50 (*)    Hgb urine dipstick 2+ (*)    Protein, ur 100 (*)    Nitrite POSITIVE (*)    Leukocytes, UA 3+ (*)    Bacteria, UA MANY (*)    Squamous Epithelial / LPF 0-5 (*)    All other components within normal limits  GLUCOSE, CAPILLARY - Abnormal; Notable for the following:    Glucose-Capillary 157 (*)    All other components within normal limits  URINE CULTURE  PROTIME-INR   ____________________________________________   EKG  Interpreted by me  Date: 11/03/2014  Rate: 84  Rhythm: normal sinus rhythm  QRS  Axis: normal  Intervals: normal  ST/T Wave abnormalities: normal  Conduction Disutrbances: none  Narrative Interpretation: unremarkable      ____________________________________________    RADIOLOGY  CT abdomen and pelvis reveals a 5 mm nonobstructing left ureteral stone, left ureteral stent in good position, left upper pole of the kidney pyelonephritis with a 2.5 cm renal abscess.  ____________________________________________   PROCEDURES   ____________________________________________   INITIAL IMPRESSION / ASSESSMENT AND PLAN / ED COURSE  Pertinent labs & imaging results that were available during my care of the patient were reviewed by me and considered in my medical decision making (see chart for details).  Patient presents with left-sided abdominal pain and tenderness in the setting of a large kidney stone on that side with a stent. Vital signs are unremarkable, but especially given the finding [facility] chart, there is concern for pyelonephritis. We'll get a CT of the abdomen and pelvis while checking labs. By reviewing the chart I see that urology does plan to do a procedure to likely remove the stent and the 7 mm stone, but they're not able to do so until the patient is able to obtain medical and cardiology clearance. They're unable to get an outpatient appointment until January, by which time the patient will certainly become much worse and decompensated.  ----------------------------------------- 6:47 PM on 11/03/2014 -----------------------------------------  Workup reveals urinary tract infection with pyelonephritis and renal abscess. We'll start the patient on IV Zosyn for broad-spectrum coverage including an aerobics and admit. Discussed with urology Dr. Sherron Monday who agrees with medical management for now. Future lithotripsy is elected and the stent does not require further  intervention at this time.   ____________________________________________   FINAL CLINICAL  IMPRESSION(S) / ED DIAGNOSES  Final diagnoses:  Pyelonephritis  Renal abscess, left      Sharman CheekPhillip [place], MD 11/03/14 [phone]

## 2014-11-03 NOTE — Telephone Encounter (Deleted)
error 

## 2014-11-03 NOTE — ED Notes (Signed)
Pt sent here by Md for medical clearance for kidney surgery. Pt states that md is concerned about pts heart. Pt has had rapid HR and palpitations in the past.

## 2014-11-03 NOTE — H&P (Addendum)
[facility] [place] at [facility]   PATIENT NAME: Rose Luna    MR#:  [id number]  DATE OF BIRTH:  1955/07/15  DATE OF ADMISSION:  11/03/2014  PRIMARY CARE PHYSICIAN: [facility] [employer]   REQUESTING/REFERRING PHYSICIAN: Sharman Cheek  CHIEF COMPLAINT:   Chief Complaint  Patient presents with  . Palpitations    HISTORY OF PRESENT ILLNESS: Rose Luna  is a 59 y.o. female with a known history of diabetes type 2, hyperlipidemia and asthma who was recently hospitalized in [place] with complaint of abdominal pain and sepsis. Patient was hospitalized to the ICU and was placed on antibiotics. She was also noticed to have acute renal failure and a urinary tract infection associated with renal stone. Patient had a renal stent placement. Patient also was found to be in DKA. All those resolved. She was also during hospitalization noticed to be on atrial fibrillation with RVR. Patient was discharged home on Coumadin and amiodarone however she did not get that feels she had the rest of her medications filled. She is a very poor historian.patient was seen at the urology clinic. They recommended that she have a cardiac clearance prior to any procedure. Due to her atrial fibrillation history recently. She was sent here to the emergency room had a CT of the abdomen which showed left ureteral stent in appropriate position mild residual left renal pelviectasis. Also pyelonephritis involving the upper lobe of the left kidney with probable small renal parenchymal abscess measuring 2.7 cm was noted. Therefore were asked to admit the patient.  Patient herself denies any chest pain or shortness of breath. Denies any abdominal pain nausea or vomiting.   PAST MEDICAL HISTORY:   Past Medical History  Diagnosis Date  . Diabetes mellitus without complication (HCC)   . Type 2 diabetes mellitus (HCC) 04/18/2009  . Acute cystitis 09/19/2014  . Injury of kidney 09/19/2014  . Uremia 09/19/2014   . Infection of urinary tract 09/19/2014  . Elevated erythrocyte sedimentation rate 09/22/2014  . HLD (hyperlipidemia) 04/19/2009  . Asthma   . History of renal stent   . Atrial fibrillation (HCC)     PAST SURGICAL HISTORY:  Past Surgical History  Procedure Laterality Date  . Cesarean section    . Urethral stent    . Hysterotomy      SOCIAL HISTORY:  Social History  Substance Use Topics  . Smoking status: Current Every Day Smoker -- 1.00 packs/day    Types: Cigarettes  . Smokeless tobacco: Not on file  . Alcohol Use: No    FAMILY HISTORY:  Family History  Problem Relation Age of Onset  . Adopted: Yes  . Other      patient adopted    DRUG ALLERGIES: No Known Allergies  REVIEW OF SYSTEMS:   CONSTITUTIONAL: No fever, fatigue or weakness.  EYES: No blurred or double vision.  EARS, NOSE, AND THROAT: No tinnitus or ear pain.  RESPIRATORY: No cough, shortness of breath, wheezing or hemoptysis.  CARDIOVASCULAR: No chest pain, orthopnea, edema. Occasional palpitations GASTROINTESTINAL: No nausea, vomiting, diarrhea or abdominal pain.  GENITOURINARY: No dysuria, hematuria.  ENDOCRINE: No polyuria, nocturia,  HEMATOLOGY: No anemia, easy bruising or bleeding SKIN: No rash or lesion. MUSCULOSKELETAL: No joint pain or arthritis.   NEUROLOGIC: No tingling, numbness, weakness.  PSYCHIATRY: No anxiety or depression.   MEDICATIONS AT HOME:  Prior to Admission medications   Medication Sig Start Date End Date Taking? Authorizing Provider  aspirin EC 81 MG tablet Take 81  mg by mouth daily.   Yes Historical Provider, MD  atorvastatin (LIPITOR) 40 MG tablet Take 40 mg by mouth at bedtime.   Yes Historical Provider, MD  fluticasone (FLOVENT HFA) 110 MCG/ACT inhaler Inhale 1 puff into the lungs 2 (two) times daily as needed (for shortness of breath).   Yes Historical Provider, MD  fluticasone (VERAMYST) 27.5 MCG/SPRAY nasal spray Place 1 spray into the nose daily.   Yes Historical  Provider, MD  gabapentin (NEURONTIN) 300 MG capsule Take 900 mg by mouth at bedtime.   Yes Historical Provider, MD  gemfibrozil (LOPID) 600 MG tablet Take 600 mg by mouth 2 (two) times daily.   Yes Historical Provider, MD  insulin detemir (LEVEMIR) 100 UNIT/ML injection Inject 40 Units into the skin 2 (two) times daily.   Yes Historical Provider, MD  metFORMIN (GLUCOPHAGE) 1000 MG tablet Take 1,000 mg by mouth 2 (two) times daily.   Yes Historical Provider, MD  ranitidine (ZANTAC) 150 MG tablet Take 150 mg by mouth 2 (two) times daily.   Yes Historical Provider, MD      PHYSICAL EXAMINATION:   VITAL SIGNS: Blood pressure 132/66, pulse 82, temperature 98.6 F (37 C), temperature source Oral, height  (1.626 m), weight 101.606 kg (224 lb), SpO2 95 %.  GENERAL:  59 y.o.-year-old patient lying in the bed with no acute distress.  EYES: Pupils equal, round, reactive to light and accommodation. No scleral icterus. Extraocular muscles intact.  HEENT: Head atraumatic, normocephalic. Oropharynx and nasopharynx clear.  NECK:  Supple, no jugular venous distention. No thyroid enlargement, no tenderness.  LUNGS: Normal breath sounds bilaterally, no wheezing, rales,rhonchi or crepitation. No use of accessory muscles of respiration.  CARDIOVASCULAR: S1, S2 normal. No murmurs, rubs, or gallops.  ABDOMEN: Soft, nontender, nondistended. Bowel sounds present. No organomegaly or mass.  EXTREMITIES: No pedal edema, cyanosis, or clubbing.  NEUROLOGIC: Cranial nerves II through XII are intact. Muscle strength 5/5 in all extremities. Sensation intact. Gait not checked.  PSYCHIATRIC: The patient is alert and oriented x 3.  SKIN: No obvious rash, lesion, or ulcer.   LABORATORY PANEL:   CBC  Recent Labs Lab 11/01/14 1525 11/03/14 1559  WBC 13.5* 14.3*  HGB  --  11.2*  HCT 32.9* 33.9*  PLT  --  391  MCV  --  86.2  MCH 28.5 28.5  MCHC 33.4 33.1  RDW 15.6* 16.0*  LYMPHSABS 4.7* 4.8*  MONOABS  --   1.5*  EOSABS  --  1.1*  BASOSABS 0.1 0.1   ------------------------------------------------------------------------------------------------------------------  Chemistries   Recent Labs Lab 11/01/14 1525 11/03/14 1559  NA 139 137  K 4.6 4.1  CL 97 102  CO2 19 23  GLUCOSE 139* 186*  BUN 26* 25*  CREATININE 0.99 0.92  CALCIUM 10.1 10.1   ------------------------------------------------------------------------------------------------------------------ estimated creatinine clearance is 79.2 mL/min (by C-G formula based on Cr of 0.92). ------------------------------------------------------------------------------------------------------------------ No results for input(s): TSH, T4TOTAL, T3FREE, THYROIDAB in the last 72 hours.  Invalid input(s): FREET3   Coagulation profile  Recent Labs Lab 11/01/14 1525 11/03/14 1559  INR 1.0 1.01   ------------------------------------------------------------------------------------------------------------------- No results for input(s): DDIMER in the last 72 hours. -------------------------------------------------------------------------------------------------------------------  Cardiac Enzymes No results for input(s): CKMB, TROPONINI, MYOGLOBIN in the last 168 hours.  Invalid input(s): CK ------------------------------------------------------------------------------------------------------------------ Invalid input(s): POCBNP  ---------------------------------------------------------------------------------------------------------------  Urinalysis    Component Value Date/Time   COLORURINE YELLOW* 11/03/2014 1640   APPEARANCEUR CLOUDY* 11/03/2014 1640   LABSPEC 1.014 11/03/2014 1640   PHURINE 6.0 11/03/2014  1640   GLUCOSEU 50* 11/03/2014 1640   HGBUR 2+* 11/03/2014 1640   BILIRUBINUR NEGATIVE 11/03/2014 1640   KETONESUR NEGATIVE 11/03/2014 1640   PROTEINUR 100* 11/03/2014 1640   NITRITE POSITIVE* 11/03/2014 1640    LEUKOCYTESUR 3+* 11/03/2014 1640     RADIOLOGY: Dg Chest 2 View  11/03/2014  CLINICAL DATA:  Recurrent right chest and upper quadrant pain. EXAM: CHEST  2 VIEW COMPARISON:  09/09/2012 FINDINGS: The heart size and mediastinal contours are within normal limits. Both lungs are clear. Mild scarring in the lateral left lung base is unchanged. The visualized skeletal structures are unremarkable. IMPRESSION: No active cardiopulmonary disease. Electronically Signed   By: Myles Rosenthal M.D.   On: 11/03/2014 18:02   Ct Abdomen Pelvis W Contrast  11/03/2014  CLINICAL DATA:  Bilateral abdominal pain and tenderness. 6 weeks status post left ureteral stent placement for left ureteral calculus and urosepsis. EXAM: CT ABDOMEN AND PELVIS WITH CONTRAST TECHNIQUE: Multidetector CT imaging of the abdomen and pelvis was performed using the standard protocol following bolus administration of intravenous contrast. CONTRAST:  OMNIPAQUE IOHEXOL 350 MG/ML SOLN COMPARISON:  None. FINDINGS: Lower chest:  No acute findings. Hepatobiliary: No masses or other significant abnormality. Gallbladder is unremarkable. Pancreas: No mass, inflammatory changes, or other significant abnormality. Spleen: Within normal limits in size and appearance. Adrenals/Urinary Tract: No adrenal masses identified. Small right renal cyst noted. Left ureteral stent is seen in appropriate position. There is mild left renal pelvicaliectasis with 5 mm calculus in the lower pole collecting system. Gas is noted within the renal collecting system but not within the renal parenchyma. Ill-defined areas of decreased attenuation are seen in the upper pole of the left kidney, consistent with pyelonephritis. A more well-defined fluid attenuation collection in the medial upper pole measures 2.7 cm in maximum diameter, suspicious for small renal abscess. Urinary bladder is unremarkable. Stomach/Bowel: No evidence of obstruction, inflammatory process, or abnormal fluid  collections. Vascular/Lymphatic: No pathologically enlarged lymph nodes. No evidence of abdominal aortic aneurysm. Reproductive: No mass or other significant abnormality. Other: None. Musculoskeletal:  No suspicious bone lesions identified. IMPRESSION: Left ureteral stent in appropriate position. Mild residual left renal pelvicaliectasis and 5 mm nonobstructive calculus in the lower pole collecting system. Pyelonephritis involving upper pole of left kidney, with probable small renal parenchymal abscess measuring 2.7 cm. Electronically Signed   By: Myles Rosenthal M.D.   On: 11/03/2014 18:16    EKG: Orders placed or performed in visit on 11/03/14  . EKG 12-Lead  . EKG 12-Lead    IMPRESSION AND PLAN: Patient is a 59 year old white female with recent hospitalization for pyelonephritis and a renal stent, now has small renal abscess and persistent pyelonephritis elevated WBC count  1. Pyelonephritis associated with abscess at this time patient will obtain blood cultures patient will be will be on Zosyn. And a urology consult will be obtained   2. Diabetes type 2: Continue Levemir as taking at home place her on sliding scale insulin, continue metformin  3. History of atrial fibrillation during recent hospitalization, echocardiogram showed normal EF she did have severe to moderate tricuspid regurg. I suspect the atrial fibrillation was likely due to's significant amount of stress on her body from the sepsis.  4. Hyperlipidemia continue atorvastatin and gemfibrozil  5. Nicotine addiction smoking cessation provided for minutes spent recommended patient stop smoking nitroglycerin patch will be started      All the records are reviewed and case discussed with ED provider. Management plans discussed  with the patient, family and they are in agreement.  CODE STATUS:    Code Status Orders        Start     Ordered   11/03/14 1926  Full code   Continuous     11/03/14 1927       TOTAL TIME  TAKING CARE OF THIS PATIENT: 55 minutes.    Auburn BilberryPATEL, Alexa Golebiewski M.D on 11/03/2014 at 7:34 PM  Between 7am to 6pm - Pager - [phone]  After 6pm go to [email or web address] - password [email or web address]  [facility]  Office  [phone]  CC: Primary care physician; [facility] [employer]

## 2014-11-03 NOTE — Telephone Encounter (Signed)
Medication sent to pharmacy  

## 2014-11-04 ENCOUNTER — Inpatient Hospital Stay
Admit: 2014-11-04 | Discharge: 2014-11-04 | Disposition: A | Discharge status: Home / Self Care | Payer: Self-pay | Attending: Physician Assistant | Admitting: Physician Assistant

## 2014-11-04 DIAGNOSIS — R000 Tachycardia, unspecified: Secondary | ICD-10-CM

## 2014-11-04 DIAGNOSIS — N151 Renal and perinephric abscess: Secondary | ICD-10-CM

## 2014-11-04 LAB — BASIC METABOLIC PANEL
ANION GAP: 5 (ref 5–15)
BUN: 22 mg/dL — ABNORMAL HIGH (ref 6–20)
CHLORIDE: 106 mmol/L (ref 101–111)
CO2: 26 mmol/L (ref 22–32)
CREATININE: 0.9 mg/dL (ref 0.44–1.00)
Calcium: 8.8 mg/dL — ABNORMAL LOW (ref 8.9–10.3)
GFR calc non Af Amer: >60 mL/min (ref >60)
Glucose, Bld: 262 mg/dL — ABNORMAL HIGH (ref 65–99)
POTASSIUM: 3.9 mmol/L (ref 3.5–5.1)
SODIUM: 137 mmol/L (ref 135–145)

## 2014-11-04 LAB — URINE CULTURE

## 2014-11-04 LAB — GLUCOSE, CAPILLARY
GLUCOSE-CAPILLARY: 349 mg/dL — AB (ref 65–99)
GLUCOSE-CAPILLARY: 228 mg/dL — AB (ref 65–99)
GLUCOSE-CAPILLARY: 122 mg/dL — AB (ref 65–99)
Glucose-Capillary: 250 mg/dL — ABNORMAL HIGH (ref 65–99)
Glucose-Capillary: 210 mg/dL — ABNORMAL HIGH (ref 65–99)

## 2014-11-04 LAB — CBC
HEMATOCRIT: 31 % — AB (ref 35.0–47.0)
HEMOGLOBIN: 10.3 g/dL — AB (ref 12.0–16.0)
MCH: 28.5 pg (ref 26.0–34.0)
MCHC: 33.1 g/dL (ref 32.0–36.0)
MCV: 86 fL (ref 80.0–100.0)
Platelets: 381 10*3/uL (ref 150–440)
RBC: 3.61 MIL/uL — AB (ref 3.80–5.20)
RDW: 15.9 % — ABNORMAL HIGH (ref 11.5–14.5)
WBC: 13.2 10*3/uL — AB (ref 3.6–11.0)

## 2014-11-04 MED ORDER — PIPERACILLIN-TAZOBACTAM 4.5 G IVPB
4.5000 g | Freq: Three times a day (TID) | INTRAVENOUS | Status: AC
  Administered 2014-11-04: 12:00:00 4.5 g via INTRAVENOUS
  Filled 2014-11-04 (×2): qty 100

## 2014-11-04 MED ORDER — PIPERACILLIN-TAZOBACTAM 4.5 G IVPB
4.5000 g | Freq: Three times a day (TID) | INTRAVENOUS | Status: DC
  Administered 2014-11-04 – 2014-11-05 (×2): 4.5 g via INTRAVENOUS
  Filled 2014-11-04 (×4): qty 100

## 2014-11-04 NOTE — Progress Notes (Signed)
Patients meds in pharmacy signed out and returned to boyfriend to take home.

## 2014-11-04 NOTE — Consult Note (Signed)
Received consult from ward clerk regarding left renal abscess.  I will see her at the end of the day.  Quick chart review performed.  Afebrile, stable vitals, minimal pain.  2.807mm abscess in upper pole of left kidney.  Could potentially need this drained, but would consider abx and repeating CT in 2 weeks if she remains stable/afebrile.

## 2014-11-04 NOTE — Progress Notes (Signed)
*  PRELIMINARY RESULTS* Echocardiogram 2D Echocardiogram has been performed.  Georgann HousekeeperJerry R Hege 11/04/2014, 9:27 AM

## 2014-11-04 NOTE — Progress Notes (Signed)
Inpatient Diabetes Program Recommendations  AACE/ADA: New Consensus Statement on Inpatient Glycemic Control (2015)  Target Ranges:  Prepandial:   less than 140 mg/dL      Peak postprandial:   less than 180 mg/dL (1-2 hours)      Critically ill patients:  140 - 180 mg/dL   Review of Glycemic Control:  Results for Rose Luna, Rose Luna (MRN [id number]) as of 11/04/2014 08:35  Ref. Range 11/03/2014 17:13 11/03/2014 23:33 11/04/2014 07:46  Glucose-Capillary Latest Ref Range: 65-99 mg/dL 130157 (H) 865260 (H) 784250 (H)    Diabetes history: Type 2 diabetes Outpatient Diabetes medications: Levemir 40 units bid, Metformin 1000 mg bid Current orders for Inpatient glycemic control: Levemir 40 units bid, Metformin 1000 mg bid, Novolog sensitive tid with meals  Inpatient Diabetes Program Recommendations:   Please consider holding Metformin while patient is in the hospital.  Also consider adding Novlog meal coverage 4 units tid with meals and adding HS coverage.  Thanks, Beryl MeagerJenny Marylyn Appenzeller, RN, BC-ADM Inpatient Diabetes Coordinator Pager [phone] (8a-5p)

## 2014-11-04 NOTE — Consult Note (Signed)
I have been asked to see the patient by Dr. Delfino LovettVipul Shah, MD for evaluation and management of left renal abscess.  History of present illness: 59 year old female with poorly controlled diabetes who presented to the emergency department for evaluation of her tachycardia from the urology clinic where she was being seen for further management of her left nonobstructing renal stone.  The patient's current episode began in mid September while [place] was [place] There she presented to the emergency department and septic shock with DKA secondary to an infected left proximal ureteral stone. She underwent a left ureteral stent on 09/19/14. Her hospital course included an ICU stay for hemodynamic instability. Her creatinine rose to 4.5. The patient was found to have A. fib with RVR. She was started on amiodarone and spontaneously converted back to sinus rhythm. She also she was found to have Escherichia coli bacteremia which was resistant to penicillin but sensitive to everything else. She was discharged home on Bactrim, received a total of 14 days of antibiotics. Prior to the patient's presentation, she had been off of antibiotics for at least 2 weeks. She denies any flank pain, fevers, or significant voiding symptoms including dysuria. She has had some difficulty controlling her blood sugars recently. A CT scan done in the emergency department prior to this admission demonstrated small micro-abscesses in the left kidney with a dominant one measuring 2.7 cm. There was air within the collecting system, no parenchymal air. There was no significant hydronephrosis.   Review of systems: A 12 point comprehensive review of systems was obtained and is negative unless otherwise stated in the history of present illness.  Patient Active Problem List   Diagnosis Date Noted  . Retained ureteral stent 11/03/2014  . Renal abscess 11/03/2014  . Elevated erythrocyte sedimentation rate 09/22/2014  . Acute  cystitis 09/19/2014  . Injury of kidney 09/19/2014  . Hypomagnesemia 09/19/2014  . Below normal amount of sodium in the blood 09/19/2014  . Elevated WBC count 09/19/2014  . Uremia 09/19/2014  . Infection of urinary tract 09/19/2014  . Current tobacco use 12/05/2011  . Adiposity 05/08/2011  . HLD (hyperlipidemia) 04/19/2009  . Type 2 diabetes mellitus (HCC) 04/18/2009    No current facility-administered medications on file prior to encounter.   No current outpatient prescriptions on file prior to encounter.    Past Medical History  Diagnosis Date  . Diabetes mellitus without complication (HCC)   . Type 2 diabetes mellitus (HCC) 04/18/2009  . Acute cystitis 09/19/2014  . Injury of kidney 09/19/2014  . Uremia 09/19/2014  . Infection of urinary tract 09/19/2014  . Elevated erythrocyte sedimentation rate 09/22/2014  . HLD (hyperlipidemia) 04/19/2009  . Asthma   . History of renal stent   . Atrial fibrillation [facility](HCC)     Past Surgical History  Procedure Laterality Date  . Cesarean section    . Urethral stent    . Hysterotomy      Social History  Substance Use Topics  . Smoking status: Current Every Day Smoker -- 1.00 packs/day    Types: Cigarettes  . Smokeless tobacco: None  . Alcohol Use: No    Family History  Problem Relation Age of Onset  . Adopted: Yes  . Other      patient adopted    PE: Filed Vitals:   11/03/14 2203 11/04/14 0523 11/04/14 0750 11/04/14 1512  BP: 114/46 118/47  134/66  Pulse: 76 68  81  Temp: 97.6 F (36.4 C) 97.9 F (36.6  C)  99 F (37.2 C)  TempSrc: Oral Oral  Oral  Resp: Height:      Weight:      SpO2: 95% 96% 93% 98%   Patient appears to be in no acute distress  patient is alert and oriented x3 Atraumatic normocephalic head No cervical or supraclavicular lymphadenopathy appreciated No increased work of breathing, no audible wheezes/rhonchi Regular sinus rhythm/rate Abdomen is soft, she is tender on the left side,  somewhat diffusely, and has mild left CVA tenderness Lower extremities are symmetric without appreciable edema Grossly neurologically intact No identifiable skin lesions   Recent Labs  11/03/14 1559 11/04/14 0431  WBC 14.3* 13.2*  HGB 11.2* 10.3*  HCT 33.9* 31.0*    Recent Labs  11/03/14 1559 11/04/14 0431  NA 137 137  K 4.1 3.9  CL 102 106  CO2 23 26  GLUCOSE 186* 262*  BUN 25* 22*  CREATININE 0.92 0.90  CALCIUM 10.1 8.8*    Recent Labs  11/03/14 1559  INR 1.01   No results for input(s): LABURIN in the last 72 hours. Results for orders placed or performed during the hospital encounter of 11/03/14  Urine culture     Status: None   Collection Time: 11/03/14  4:40 PM  Result Value Ref Range Status   Specimen Description URINE, CLEAN CATCH  Final   Special Requests NONE  Final   Culture MULTIPLE SPECIES PRESENT, SUGGEST RECOLLECTION  Final   Report Status 11/04/2014 FINAL  Final    Imaging: I've independently reviewed the patient's CT scan, below the findings from the urinary tract.  Left ureteral stent is seen in appropriate position. There is mild left renal pelvicaliectasis with 5 mm calculus in the lower pole collecting system. Gas is noted within the renal collecting system but not within the renal parenchyma. Ill-defined areas of decreased attenuation are seen in the upper pole of the left kidney, consistent with pyelonephritis. A more well-defined fluid attenuation collection in the medial upper pole measures 2.7 cm in maximum diameter, suspicious for small renal abscess. Urinary bladder is unremarkable.  Imp: The patient has a 2.7 cm area in the upper pole medially concerning for an abscess. She has has several other hypoattenuated areas within the kidney consistent with pyelonephritis. 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   This is likely related to her Escherichia coli bacteremia 6 weeks ago. At that time, the Escherichia coli was  resistant to penicillin but sensitive to everything else including cephalexin. Remarkably, she is relatively asymptomatic, hemodynamically stable, and afebrile.  Recommendations: The patient may well need that 2.7 cm collection in the upper pole of the left kidney drained area however, its worth a trial of antibiotics and reimaging in 7 days given that she is asymptomatic. This would avoid the morbidity and discomfort of a drain within the kidney. As such, I recommended the patient be placed on ciprofloxacin 500 mg twice a day. This should be continued for a minimum of 14 days. She will be scheduled for an outpatient abdominal CT scan followed by an appointment in the urology clinic. We discussed return precautions including signs and symptoms of infection or worsening pain.  Thank you for involving me in this patient's care, we will continue to follow along.  Berniece Salines W

## 2014-11-04 NOTE — Plan of Care (Signed)
Problem: Safety: Goal: Ability to remain free from injury will improve Outcome: Progressing Bed alarm on.  Pt calls with bathroom needs. Walks with a cane.

## 2014-11-04 NOTE — Plan of Care (Signed)
Problem: Education: Goal: Knowledge of [place] [facility] Education information/materials will improve Individualism: Pt comes from home with boyfriend. She walks with a cane r/t weakness on her right side.  Home meds sent to pharmacy. HOH.  Outcome: Progressing IVF infusing, IV ABX infusing. No c/o pain     Problem: Safety: Goal: Ability to remain free from injury will improve Outcome: Progressing Pt independent with needs. Calls out for assistance.

## 2014-11-04 NOTE — Progress Notes (Signed)
[facility] [place] [facility]   PATIENT NAME: Rose Luna    MR#:  [id number]  DATE OF BIRTH:  02/20/1955  SUBJECTIVE:  CHIEF COMPLAINT:   Chief Complaint  Patient presents with  . Palpitations  Feels good. No complaints REVIEW OF SYSTEMS:  Review of Systems  Constitutional: Negative for fever, weight loss, malaise/fatigue and diaphoresis.  HENT: Negative for ear discharge, ear pain, hearing loss, nosebleeds, sore throat and tinnitus.   Eyes: Negative for blurred vision and pain.  Respiratory: Negative for cough, hemoptysis, shortness of breath and wheezing.   Cardiovascular: Negative for chest pain, palpitations, orthopnea and leg swelling.  Gastrointestinal: Negative for heartburn, nausea, vomiting, abdominal pain, diarrhea, constipation and blood in stool.  Genitourinary: Negative for dysuria, urgency and frequency.  Musculoskeletal: Negative for myalgias and back pain.  Skin: Negative for itching and rash.  Neurological: Negative for dizziness, tingling, tremors, focal weakness, seizures, weakness and headaches.  Psychiatric/Behavioral: Negative for depression. The patient is not nervous/anxious.    DRUG ALLERGIES:  No Known Allergies VITALS:  Blood pressure 134/66, pulse 81, temperature 99 F (37.2 C), temperature source Oral, resp. rate 20, height 5\' 4"  (1.626 m), weight 101.606 kg (224 lb), SpO2 98 %. PHYSICAL EXAMINATION:  Physical Exam  Constitutional: She is oriented to person, place, and time and well-developed, well-nourished, and in no distress.  HENT:  Head: Normocephalic and atraumatic.  Eyes: Conjunctivae and EOM are normal. Pupils are equal, round, and reactive to light.  Neck: Normal range of motion. Neck supple. No tracheal deviation present. No thyromegaly present.  Cardiovascular: Normal rate, regular rhythm and normal heart sounds.   Pulmonary/Chest: Effort normal and breath sounds normal. No respiratory distress. She has no  wheezes. She exhibits no tenderness.  Abdominal: Soft. Bowel sounds are normal. She exhibits no distension. There is no tenderness.  Musculoskeletal: Normal range of motion.  Neurological: She is alert and oriented to person, place, and time. No cranial nerve deficit.  Skin: Skin is warm and dry. No rash noted.  Psychiatric: Mood and affect normal.   LABORATORY PANEL:   CBC  Recent Labs Lab 11/04/14 0431  WBC 13.2*  HGB 10.3*  HCT 31.0*  PLT 381   ------------------------------------------------------------------------------------------------------------------ Chemistries   Recent Labs Lab 11/04/14 0431  NA 137  K 3.9  CL 106  CO2 26  GLUCOSE 262*  BUN 22*  CREATININE 0.90  CALCIUM 8.8*   RADIOLOGY:  Dg Chest 2 View  11/03/2014  CLINICAL DATA:  Recurrent right chest and upper quadrant pain. EXAM: CHEST  2 VIEW COMPARISON:  09/09/2012 FINDINGS: The heart size and mediastinal contours are within normal limits. Both lungs are clear. Mild scarring in the lateral left lung base is unchanged. The visualized skeletal structures are unremarkable. IMPRESSION: No active cardiopulmonary disease. Electronically Signed   By: Myles RosenthalJohn  Stahl M.D.   On: 11/03/2014 18:02   Ct Abdomen Pelvis W Contrast  11/03/2014  CLINICAL DATA:  Bilateral abdominal pain and tenderness. 6 weeks status post left ureteral stent placement for left ureteral calculus and urosepsis. EXAM: CT ABDOMEN AND PELVIS WITH CONTRAST TECHNIQUE: Multidetector CT imaging of the abdomen and pelvis was performed using the standard protocol following bolus administration of intravenous contrast. CONTRAST:  100mL OMNIPAQUE IOHEXOL 350 MG/ML SOLN COMPARISON:  None. FINDINGS: Lower chest:  No acute findings. Hepatobiliary: No masses or other significant abnormality. Gallbladder is unremarkable. Pancreas: No mass, inflammatory changes, or other significant abnormality. Spleen: Within normal limits in size and appearance.  Adrenals/Urinary  Tract: No adrenal masses identified. Small right renal cyst noted. Left ureteral stent is seen in appropriate position. There is mild left renal pelvicaliectasis with 5 mm calculus in the lower pole collecting system. Gas is noted within the renal collecting system but not within the renal parenchyma. Ill-defined areas of decreased attenuation are seen in the upper pole of the left kidney, consistent with pyelonephritis. A more well-defined fluid attenuation collection in the medial upper pole measures 2.7 cm in maximum diameter, suspicious for small renal abscess. Urinary bladder is unremarkable. Stomach/Bowel: No evidence of obstruction, inflammatory process, or abnormal fluid collections. Vascular/Lymphatic: No pathologically enlarged lymph nodes. No evidence of abdominal aortic aneurysm. Reproductive: No mass or other significant abnormality. Other: None. Musculoskeletal:  No suspicious bone lesions identified. IMPRESSION: Left ureteral stent in appropriate position. Mild residual left renal pelvicaliectasis and 5 mm nonobstructive calculus in the lower pole collecting system. Pyelonephritis involving upper pole of left kidney, with probable small renal parenchymal abscess measuring 2.7 cm. Electronically Signed   By: Myles Rosenthal M.D.   On: 11/03/2014 18:16   ASSESSMENT AND PLAN:  Patient is a 59 year old white female with recent hospitalization for pyelonephritis and a renal stent, now has small renal abscess and persistent pyelonephritis elevated WBC count  1. Pyelonephritis associated with renal abscess - Neg blood cultures. continue Zosyn. Await urology consult   2. Diabetes type 2: Continue Levemir as taking at home place her on sliding scale insulin, continue metformin  3. History of atrial fibrillation during recent hospitalization, echocardiogram showed normal EF she did have severe to moderate tricuspid regurg. atrial fibrillation likely due to's significant amount of stress on her body from  the sepsis. Appreciate Cardio input  4. Hyperlipidemia continue atorvastatin and gemfibrozil  5. Nicotine addiction smoking cessation provided for minutes spent recommended patient stop smoking nitroglycerin patch will be started     All the records are reviewed and case discussed with Care Management/Social Worker. Management plans discussed with the patient, family and they are in agreement.  CODE STATUS: Full code  TOTAL TIME TAKING CARE OF THIS PATIENT: 35 minutes.   More than 50% of the time was spent in counseling/coordination of care: YES  POSSIBLE D/C IN AM, DEPENDING ON CLINICAL CONDITION and urology evaluation   [facility], Monica Codd M.D on 11/04/2014 at 3:21 PM  Between 7am to 6pm - Pager - [phone]  After 6pm go to [email or web address] - password [email or web address] [facility]  [facility]  Office  [phone]  CC:  Primary care physician; [employer]

## 2014-11-04 NOTE — Consult Note (Signed)
Primary Cardiologist: None   Reason for Consultation :Atrial fibrillation/cardiac clearance   HPI : This is a 58yoF with PMHx DM2, HL and recently acute pyelonephritis 2/2 obstructing kidney stone and a-fib with RVR in facility in [place]. She has a temporary left uretal stent placed and is seen today for cardiac clearance for permanent treatment of stone/stent. She denies CP, pressure, tightness, SOB, palpitations, orthopnea, PND or pedal edema. No cardiac hx that she is aware of.         Review of Systems: General: negative for chills, fever, night sweats or weight changes.  Cardiovascular: negative for chest pain, edema, orthopnea, palpitations, paroxysmal nocturnal dyspnea, shortness of breath or dyspnea on exertion Dermatological: negative for rash Respiratory: negative for cough or wheezing Urologic: negative for hematuria Abdominal: negative for nausea, vomiting, diarrhea, bright red blood per rectum, melena, or hematemesis Neurologic: negative for visual changes, syncope, or dizziness All other systems reviewed and are otherwise negative except as noted above.    Past Medical History  Diagnosis Date  . Diabetes mellitus without complication (HCC)   . Type 2 diabetes mellitus (HCC) 04/18/2009  . Acute cystitis 09/19/2014  . Injury of kidney 09/19/2014  . Uremia 09/19/2014  . Infection of urinary tract 09/19/2014  . Elevated erythrocyte sedimentation rate 09/22/2014  . HLD (hyperlipidemia) 04/19/2009  . Asthma   . History of renal stent   . Atrial fibrillation (HCC)     Medications Prior to Admission  Medication Sig Dispense Refill  . aspirin EC 81 MG tablet Take 81 mg by mouth daily.    Marland Kitchen atorvastatin (LIPITOR) 40 MG tablet Take 40 mg by mouth at bedtime.    . fluticasone (FLOVENT HFA) 110 MCG/ACT inhaler Inhale 1 puff into the lungs 2 (two) times daily as needed (for shortness of breath).    . fluticasone (VERAMYST) 27.5 MCG/SPRAY nasal spray Place 1 spray into the nose  daily.    Marland Kitchen gabapentin (NEURONTIN) 300 MG capsule Take 900 mg by mouth at bedtime.    Marland Kitchen gemfibrozil (LOPID) 600 MG tablet Take 600 mg by mouth 2 (two) times daily.    . insulin detemir (LEVEMIR) 100 UNIT/ML injection Inject 40 Units into the skin 2 (two) times daily.    . metFORMIN (GLUCOPHAGE) 1000 MG tablet Take 1,000 mg by mouth 2 (two) times daily.    . ranitidine (ZANTAC) 150 MG tablet Take 150 mg by mouth 2 (two) times daily.       Marland Kitchen atorvastatin  40 mg Oral QHS  . budesonide (PULMICORT) nebulizer solution  0.25 mg Nebulization BID  . famotidine  20 mg Oral BID  . fluticasone  1 spray Each Nare Daily  . gabapentin  900 mg Oral QHS  . gemfibrozil  600 mg Oral BID  . insulin aspart  0-9 Units Subcutaneous TID WC  . insulin detemir  40 Units Subcutaneous BID  . metFORMIN  1,000 mg Oral BID WC  . nicotine  21 mg Transdermal Daily  . piperacillin-tazobactam (ZOSYN)  IV  4.5 g Intravenous 3 times per day  . pneumococcal 23 valent vaccine  0.5 mL Intramuscular Tomorrow-1000  . sodium chloride  3 mL Intravenous Q12H    Infusions: . sodium chloride 100 mL/hr at 11/03/14 2306    No Known Allergies  Social History   Social History  . Marital Status: Divorced    Spouse Name: N/A  . Number of Children: N/A  . Years of Education: N/A   Occupational History  .  Not on file.   Social History Main Topics  . Smoking status: Current Every Day Smoker -- 1.00 packs/day    Types: Cigarettes  . Smokeless tobacco: Not on file  . Alcohol Use: No  . Drug Use: Not on file  . Sexual Activity: Not on file   Other Topics Concern  . Not on file   Social History Narrative    Family History  Problem Relation Age of Onset  . Adopted: Yes  . Other      patient adopted    PHYSICAL EXAM: Filed Vitals:   11/04/14 0523  BP: 118/47  Pulse: 68  Temp: 97.9 F (36.6 C)  Resp: 18    No intake or output data in the 24 hours ending 11/04/14 0859  General:  Well appearing. No  respiratory difficulty HEENT: normal Neck: supple. no JVD. Carotids 2+ bilat; no bruits. No lymphadenopathy or thryomegaly appreciated. Cor: PMI nondisplaced. Regular rate & rhythm. No rubs, gallops or murmurs. Lungs: clear Abdomen: soft, nontender, nondistended. No hepatosplenomegaly. No bruits or masses. Good bowel sounds. Extremities: no cyanosis, clubbing, rash, edema Neuro: alert & oriented x 3, cranial nerves grossly intact. moves all 4 extremities w/o difficulty. Affect pleasant.  ECG: NSR 84 BPM no acute changed   Results for orders placed or performed during the hospital encounter of 11/03/14 (from the past 24 hour(s))  Basic metabolic panel     Status: Abnormal   Collection Time: 11/03/14  3:59 PM  Result Value Ref Range   Sodium 137 135 - 145 mmol/L   Potassium 4.1 3.5 - 5.1 mmol/L   Chloride 102 101 - 111 mmol/L   CO2 23 22 - 32 mmol/L   Glucose, Bld 186 (H) 65 - 99 mg/dL   BUN 25 (H) 6 - 20 mg/dL   Creatinine, Ser 1.61 0.44 - 1.00 mg/dL   Calcium 09.6 8.9 - 04.5 mg/dL   GFR calc non Af Amer >60 >60 mL/min   GFR calc Af Amer >60 >60 mL/min   Anion gap 12 5 - 15  CBC with Differential     Status: Abnormal   Collection Time: 11/03/14  3:59 PM  Result Value Ref Range   WBC 14.3 (H) 3.6 - 11.0 K/uL   RBC 3.93 3.80 - 5.20 MIL/uL   Hemoglobin 11.2 (L) 12.0 - 16.0 g/dL   HCT 40.9 (L) 81.1 - 91.4 %   MCV 86.2 80.0 - 100.0 fL   MCH 28.5 26.0 - 34.0 pg   MCHC 33.1 32.0 - 36.0 g/dL   RDW 78.2 (H) 95.6 - 21.3 %   Platelets 391 150 - 440 K/uL   Neutrophils Relative % 47 %   Neutro Abs 6.8 (H) 1.4 - 6.5 K/uL   Lymphocytes Relative 34 %   Lymphs Abs 4.8 (H) 1.0 - 3.6 K/uL   Monocytes Relative 10 %   Monocytes Absolute 1.5 (H) 0.2 - 0.9 K/uL   Eosinophils Relative 8 %   Eosinophils Absolute 1.1 (H) 0 - 0.7 K/uL   Basophils Relative 1 %   Basophils Absolute 0.1 0 - 0.1 K/uL  Protime-INR     Status: None   Collection Time: 11/03/14  3:59 PM  Result Value Ref Range    Prothrombin Time 13.5 11.4 - 15.0 seconds   INR 1.01   Urinalysis complete, with microscopic     Status: Abnormal   Collection Time: 11/03/14  4:40 PM  Result Value Ref Range   Color, Urine YELLOW (A) YELLOW  APPearance CLOUDY (A) CLEAR   Glucose, UA 50 (A) NEGATIVE mg/dL   Bilirubin Urine NEGATIVE NEGATIVE   Ketones, ur NEGATIVE NEGATIVE mg/dL   Specific Gravity, Urine 1.014 1.005 - 1.030   Hgb urine dipstick 2+ (A) NEGATIVE   pH 6.0 5.0 - 8.0   Protein, ur 100 (A) NEGATIVE mg/dL   Nitrite POSITIVE (A) NEGATIVE   Leukocytes, UA 3+ (A) NEGATIVE   RBC / HPF TOO NUMEROUS TO COUNT 0 - 5 RBC/hpf   WBC, UA TOO NUMEROUS TO COUNT 0 - 5 WBC/hpf   Bacteria, UA MANY (A) NONE SEEN   Squamous Epithelial / LPF 0-5 (A) NONE SEEN   WBC Clumps PRESENT    Mucous PRESENT    Budding Yeast PRESENT   Glucose, capillary     Status: Abnormal   Collection Time: 11/03/14  5:13 PM  Result Value Ref Range   Glucose-Capillary 157 (H) 65 - 99 mg/dL  Glucose, capillary     Status: Abnormal   Collection Time: 11/03/14 11:33 PM  Result Value Ref Range   Glucose-Capillary 260 (H) 65 - 99 mg/dL  CBC     Status: Abnormal   Collection Time: 11/04/14  4:31 AM  Result Value Ref Range   WBC 13.2 (H) 3.6 - 11.0 K/uL   RBC 3.61 (L) 3.80 - 5.20 MIL/uL   Hemoglobin 10.3 (L) 12.0 - 16.0 g/dL   HCT 16.131.0 (L) 09.635.0 - 04.547.0 %   MCV 86.0 80.0 - 100.0 fL   MCH 28.5 26.0 - 34.0 pg   MCHC 33.1 32.0 - 36.0 g/dL   RDW 40.915.9 (H) 81.111.5 - 91.414.5 %   Platelets 381 150 - 440 K/uL  Basic metabolic panel     Status: Abnormal   Collection Time: 11/04/14  4:31 AM  Result Value Ref Range   Sodium 137 135 - 145 mmol/L   Potassium 3.9 3.5 - 5.1 mmol/L   Chloride 106 101 - 111 mmol/L   CO2 26 22 - 32 mmol/L   Glucose, Bld 262 (H) 65 - 99 mg/dL   BUN 22 (H) 6 - 20 mg/dL   Creatinine, Ser 7.820.90 0.44 - 1.00 mg/dL   Calcium 8.8 (L) 8.9 - 10.3 mg/dL   GFR calc non Af Amer >60 >60 mL/min   GFR calc Af Amer >60 >60 mL/min   Anion gap 5 5 -  15  Glucose, capillary     Status: Abnormal   Collection Time: 11/04/14  7:46 AM  Result Value Ref Range   Glucose-Capillary 250 (H) 65 - 99 mg/dL   Dg Chest 2 View  95/6/213011/02/2014  CLINICAL DATA:  Recurrent right chest and upper quadrant pain. EXAM: CHEST  2 VIEW COMPARISON:  09/09/2012 FINDINGS: The heart size and mediastinal contours are within normal limits. Both lungs are clear. Mild scarring in the lateral left lung base is unchanged. The visualized skeletal structures are unremarkable. IMPRESSION: No active cardiopulmonary disease. Electronically Signed   By: Myles RosenthalJohn  Stahl M.D.   On: 11/03/2014 18:02   Ct Abdomen Pelvis W Contrast  11/03/2014  CLINICAL DATA:  Bilateral abdominal pain and tenderness. 6 weeks status post left ureteral stent placement for left ureteral calculus and urosepsis. EXAM: CT ABDOMEN AND PELVIS WITH CONTRAST TECHNIQUE: Multidetector CT imaging of the abdomen and pelvis was performed using the standard protocol following bolus administration of intravenous contrast. CONTRAST:  100mL OMNIPAQUE IOHEXOL 350 MG/ML SOLN COMPARISON:  None. FINDINGS: Lower chest:  No acute findings. Hepatobiliary: No masses or other  significant abnormality. Gallbladder is unremarkable. Pancreas: No mass, inflammatory changes, or other significant abnormality. Spleen: Within normal limits in size and appearance. Adrenals/Urinary Tract: No adrenal masses identified. Small right renal cyst noted. Left ureteral stent is seen in appropriate position. There is mild left renal pelvicaliectasis with 5 mm calculus in the lower pole collecting system. Gas is noted within the renal collecting system but not within the renal parenchyma. Ill-defined areas of decreased attenuation are seen in the upper pole of the left kidney, consistent with pyelonephritis. A more well-defined fluid attenuation collection in the medial upper pole measures 2.7 cm in maximum diameter, suspicious for small renal abscess. Urinary bladder is  unremarkable. Stomach/Bowel: No evidence of obstruction, inflammatory process, or abnormal fluid collections. Vascular/Lymphatic: No pathologically enlarged lymph nodes. No evidence of abdominal aortic aneurysm. Reproductive: No mass or other significant abnormality. Other: None. Musculoskeletal:  No suspicious bone lesions identified. IMPRESSION: Left ureteral stent in appropriate position. Mild residual left renal pelvicaliectasis and 5 mm nonobstructive calculus in the lower pole collecting system. Pyelonephritis involving upper pole of left kidney, with probable small renal parenchymal abscess measuring 2.7 cm. Electronically Signed   By: Myles Rosenthal M.D.   On: 11/03/2014 18:16     ASSESSMENT: Hx of a-fib with RVR. Currently in NSR   PLAN/DISCUSSION: A-fib likely 2/2 in setting of sepsis. Advise checking echo, if LVEF and  WM wnl and no evidence of thrombus, will advise proceeding with procedure as low risk.    Patient and plan discussed with supervising provider, Dr. Adrian Blackwater, who agrees with above findings.   Alinda Sierras Margarito Courser [facility] 11/04/2014 8:59 AM

## 2014-11-05 LAB — BASIC METABOLIC PANEL
ANION GAP: 8 (ref 5–15)
BUN: 16 mg/dL (ref 6–20)
CO2: 25 mmol/L (ref 22–32)
CREATININE: 0.87 mg/dL (ref 0.44–1.00)
Calcium: 9.1 mg/dL (ref 8.9–10.3)
Chloride: 107 mmol/L (ref 101–111)
GFR calc non Af Amer: >60 mL/min (ref >60)
GLUCOSE: 160 mg/dL — AB (ref 65–99)
Potassium: 4.2 mmol/L (ref 3.5–5.1)
SODIUM: 140 mmol/L (ref 135–145)

## 2014-11-05 LAB — GLUCOSE, CAPILLARY: GLUCOSE-CAPILLARY: 212 mg/dL — AB (ref 65–99)

## 2014-11-05 LAB — CBC
HCT: 30.9 % — ABNORMAL LOW (ref 35.0–47.0)
Hemoglobin: 10.2 g/dL — ABNORMAL LOW (ref 12.0–16.0)
MCH: 28.3 pg (ref 26.0–34.0)
MCHC: 33 g/dL (ref 32.0–36.0)
MCV: 85.6 fL (ref 80.0–100.0)
PLATELETS: 367 10*3/uL (ref 150–440)
RBC: 3.61 MIL/uL — ABNORMAL LOW (ref 3.80–5.20)
RDW: 16.1 % — AB (ref 11.5–14.5)
WBC: 11.1 10*3/uL — AB (ref 3.6–11.0)

## 2014-11-05 MED ORDER — CIPROFLOXACIN HCL 500 MG PO TABS: 500.0000 mg | ORAL_TABLET | Freq: Two times a day (BID) | ORAL | Status: DC

## 2014-11-05 NOTE — Care Management (Signed)
Prescription for Cipro 500mg  po BID faxed to Medication Management. Telephone call to Medication Management. No Co-pay. Medication will be ready for pick-up after 1:30pm today. Ms. Rose Luna instructed to follow-up at the [facility]. Rose GreetBrenda S Quetzalli Clos RN MSN CCM Care Management [phone]

## 2014-11-05 NOTE — Plan of Care (Signed)
Problem: Safety: Goal: Ability to remain free from injury will improve Outcome: Progressing Plan of care, progress to goals. 1.  VSS, no pain 2. Pt able to get up with cane to bathroom with minimal assist. 3. IV antibiotics 4. Anticipate home at discharge. Henriette CombsSarah Taqwa Deem RN

## 2014-11-05 NOTE — Progress Notes (Addendum)
Pt discharged home per MD order. IV removed. Pt discharge instructions given to patient. Reviewed packet. Discussed with patient about follow up care, diet, activity and medicines. All questions answered. Patient verbalized understanding. Pt to pick up prescription at medicine management clinic. Pt left via wheelchair with auxiliary and boyfriend.

## 2014-11-05 NOTE — Progress Notes (Signed)
   SUBJECTIVE: Pt feeling well. Ready to go home.    Filed Vitals:   11/04/14 0750 11/04/14 1512 11/04/14 2057 11/05/14 0534  BP:  134/66 141/74 100/57  Pulse:  81 78 61  Temp:  99 F (37.2 C) 98.7 F (37.1 C) 98.1 F (36.7 C)  TempSrc:  Oral Oral Oral  Resp:  20 18 18   Height:      Weight:      SpO2: 93% 98% 99% 92%    Intake/Output Summary (Last 24 hours) at 11/05/14 0913 Last data filed at 11/05/14 0800  Gross per 24 hour  Intake    240 ml  Output    800 ml  Net   -560 ml    LABS: Basic Metabolic Panel:  Recent Labs  16/10/9609/03/16 0431 11/05/14 0447  NA 137 140  K 3.9 4.2  CL 106 107  CO2 26 25  GLUCOSE 262* 160*  BUN 22* 16  CREATININE 0.90 0.87  CALCIUM 8.8* 9.1   Liver Function Tests: No results for input(s): AST, ALT, ALKPHOS, BILITOT, PROT, ALBUMIN in the last 72 hours. No results for input(s): LIPASE, AMYLASE in the last 72 hours. CBC:  Recent Labs  11/03/14 1559 11/04/14 0431 11/05/14 0447  WBC 14.3* 13.2* 11.1*  NEUTROABS 6.8*  --   --   HGB 11.2* 10.3* 10.2*  HCT 33.9* 31.0* 30.9*  MCV 86.2 86.0 85.6  PLT 391 381 367   Cardiac Enzymes: No results for input(s): CKTOTAL, CKMB, CKMBINDEX, TROPONINI in the last 72 hours. BNP: Invalid input(s): POCBNP D-Dimer: No results for input(s): DDIMER in the last 72 hours. Hemoglobin A1C: No results for input(s): HGBA1C in the last 72 hours. Fasting Lipid Panel: No results for input(s): CHOL, HDL, LDLCALC, TRIG, CHOLHDL, LDLDIRECT in the last 72 hours. Thyroid Function Tests: No results for input(s): TSH, T4TOTAL, T3FREE, THYROIDAB in the last 72 hours.  Invalid input(s): FREET3 Anemia Panel: No results for input(s): VITAMINB12, FOLATE, FERRITIN, TIBC, IRON, RETICCTPCT in the last 72 hours.   PHYSICAL EXAM General: Well developed, well nourished, in no acute distress HEENT:  Normocephalic and atramatic Neck:  No JVD.  Lungs: Clear bilaterally to auscultation and percussion. Heart: HRRR .  Normal S1 and S2 without gallops or murmurs.  Abdomen: Bowel sounds are positive, abdomen soft and non-tender  Msk:  Back normal, normal gait. Normal strength and tone for age. Extremities: No clubbing, cyanosis or edema.   Neuro: Alert and oriented X 3. Psych:  Good affect, responds appropriately   ASSESSMENT AND PLAN:  1. Intermittent a-fib/cardiac clearance for urology procedure: pt has been in NSR since admission. Echo wnl, normal LVEF and WM. She is low risk for procedure, advise proceeding. Pt was given f/u in office 11/16 at 10am.    Patient and plan discussed with supervising provider, Dr. Adrian BlackwaterShaukat Khan, who agrees with above findings.   Alinda SierrasEileen A. Margarito CourserRomano, PA-C [facility]  11/05/2014 9:13 AM

## 2014-11-08 LAB — URINE CULTURE: Culture: 40000

## 2014-11-08 NOTE — Discharge Summary (Signed)
[facility] - [place] at [facility]   PATIENT NAME: Rose Luna    MR#:  [id number]  DATE OF BIRTH:  [birth year]  DATE OF ADMISSION:  11/03/2014 ADMITTING PHYSICIAN: Auburn Bilberry, MD  DATE OF DISCHARGE: 11/05/2014 12:00 PM  PRIMARY CARE PHYSICIAN: [facility]    ADMISSION DIAGNOSIS:  Pyelonephritis [N12] Renal abscess, left [N15.1]  DISCHARGE DIAGNOSIS:  Active Problems:   Renal abscess  SECONDARY DIAGNOSIS:   Past Medical History  Diagnosis Date  . Diabetes mellitus without complication (HCC)   . Type 2 diabetes mellitus (HCC) 04/18/2009  . Acute cystitis 09/19/2014  . Injury of kidney 09/19/2014  . Uremia 09/19/2014  . Infection of urinary tract 09/19/2014  . Elevated erythrocyte sedimentation rate 09/22/2014  . HLD (hyperlipidemia) 04/19/2009  . Asthma   . History of renal stent   . Atrial fibrillation [facility])    HOSPITAL COURSE:  Patient is a 59 year old white female with recent hospitalization for pyelonephritis and a renal stent, was admitted for small renal abscess (2.7 cm) and persistent pyelonephritis, elevated WBC count. Please see Dr Serita Grit Patel's dictated H & P for further details.  Patient was also noted to be in A.fib with RVR likely due to infection and as infection started to get under control she converted to NSR. Cardio c/s was obtained who had no further recommendations then to obtain echo which was done and was WNL.  Patient was also evaluated by Urology for 2.7 cm area in the upper pole medially concerning for an abscess. She also had several other hypoattenuated areas within the kidney consistent with pyelonephritis. 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 At that time, the Escherichia coli was resistant to penicillin but sensitive to everything else including cephalexin. Remarkably, she remained relatively asymptomatic, hemodynamically stable, and  afebrile.  Urologist recommended that patient may need 2.7 cm collection in the upper pole of the left kidney drained area however, its worth a trial of antibiotics and reimaging in 7 days given that she was asymptomatic. This would avoid the morbidity and discomfort of a drain within the kidney. As such, they recommended the patient be placed on ciprofloxacin 500 mg twice a day for a minimum of 14 days. She will be scheduled for an outpatient abdominal CT scan followed by an appointment in the urology clinic per them. We discussed return precautions including signs and symptoms of infection or worsening pain with patient.  She was in agreement with D/C plans and was D/C home in stable condition.  DISCHARGE CONDITIONS:  stable CONSULTS OBTAINED:  Treatment Team:  Laurier Nancy, MD Crist Fat, MD DRUG ALLERGIES:  No Known Allergies  DISCHARGE MEDICATIONS:   Discharge Medication List as of 11/05/2014  8:54 AM    START taking these medications   Details  ciprofloxacin (CIPRO) 500 MG tablet Take 1 tablet (500 mg total) by mouth 2 (two) times daily., Starting 11/05/2014, Until Discontinued, Normal      CONTINUE these medications which have NOT CHANGED   Details  aspirin EC 81 MG tablet Take 81 mg by mouth daily., Until Discontinued, Historical Med    atorvastatin (LIPITOR) 40 MG tablet Take 40 mg by mouth at bedtime., Until Discontinued, Historical Med    fluticasone (FLOVENT HFA) 110 MCG/ACT inhaler Inhale 1 puff into the lungs 2 (two) times daily as needed (for shortness of breath)., Until Discontinued, Historical Med    fluticasone (VERAMYST)  27.5 MCG/SPRAY nasal spray Place 1 spray into the nose daily., Until Discontinued, Historical Med    gabapentin (NEURONTIN) 300 MG capsule Take 900 mg by mouth at bedtime., Until Discontinued, Historical Med    gemfibrozil (LOPID) 600 MG tablet Take 600 mg by mouth 2 (two) times daily., Until Discontinued, Historical Med    insulin  detemir (LEVEMIR) 100 UNIT/ML injection Inject 40 Units into the skin 2 (two) times daily., Until Discontinued, Historical Med    metFORMIN (GLUCOPHAGE) 1000 MG tablet Take 1,000 mg by mouth 2 (two) times daily., Until Discontinued, Historical Med    ranitidine (ZANTAC) 150 MG tablet Take 150 mg by mouth 2 (two) times daily., Until Discontinued, Historical Med       DISCHARGE INSTRUCTIONS:   DIET:  Regular diet DISCHARGE CONDITION:  Good ACTIVITY:  Activity as tolerated OXYGEN:  Home Oxygen: No.  Oxygen Delivery: room air DISCHARGE LOCATION:  home   If you experience worsening of your admission symptoms, develop shortness of breath, life threatening emergency, suicidal or homicidal thoughts you must seek medical attention immediately by calling 911 or calling your MD immediately  if symptoms less severe.  You Must read complete instructions/literature along with all the possible adverse reactions/side effects for all the Medicines you take and that have been prescribed to you. Take any new Medicines after you have completely understood and accpet all the possible adverse reactions/side effects.   Please note  You were cared for by a hospitalist during your hospital stay. If you have any questions about your discharge medications or the care you received while you were in the hospital after you are discharged, you can call the unit and asked to speak with the hospitalist on call if the hospitalist that took care of you is not available. Once you are discharged, your primary care physician will handle any further medical issues. Please note that NO REFILLS for any discharge medications will be authorized once you are discharged, as it is imperative that you return to your primary care physician (or establish a relationship with a primary care physician if you do not have one) for your aftercare needs so that they can reassess your need for medications and monitor your lab values.    On  the day of Discharge:  VITAL SIGNS:  Blood pressure 100/57, pulse 61, temperature 98.1 F (36.7 C), temperature source Oral, resp. rate 18, height 5\' 4"  (1.626 m), weight 101.606 kg (224 lb), SpO2 92 %. PHYSICAL EXAMINATION:  GENERAL:  59 y.o.-year-old patient lying in the bed with no acute distress.  EYES: Pupils equal, round, reactive to light and accommodation. No scleral icterus. Extraocular muscles intact.  HEENT: Head atraumatic, normocephalic. Oropharynx and nasopharynx clear.  NECK:  Supple, no jugular venous distention. No thyroid enlargement, no tenderness.  LUNGS: Normal breath sounds bilaterally, no wheezing, rales,rhonchi or crepitation. No use of accessory muscles of respiration.  CARDIOVASCULAR: S1, S2 normal. No murmurs, rubs, or gallops.  ABDOMEN: Soft, non-tender, non-distended. Bowel sounds present. No organomegaly or mass.  EXTREMITIES: No pedal edema, cyanosis, or clubbing.  NEUROLOGIC: Cranial nerves II through XII are intact. Muscle strength 5/5 in all extremities. Sensation intact. Gait not checked.  PSYCHIATRIC: The patient is alert and oriented x 3.  SKIN: No obvious rash, lesion, or ulcer.  DATA REVIEW:   CBC  Recent Labs Lab 11/05/14 0447  WBC 11.1*  HGB 10.2*  HCT 30.9*  PLT 367    Chemistries   Recent Labs Lab 11/05/14 0447  NA 140  K 4.2  CL 107  CO2 25  GLUCOSE 160*  BUN 16  CREATININE 0.87  CALCIUM 9.1    Management plans discussed with the patient, family and they are in agreement.  CODE STATUS: Full Code  TOTAL TIME TAKING CARE OF THIS PATIENT: 55 minutes.    [facility], Calandra Madura M.D on 11/08/2014 at 9:53 AM  Between 7am to 6pm - Pager - [phone]  After 6pm go to [email or web address] - password [email or web address] [facility]  [place] [facility]  Office  [phone]  CC: Primary care physician; [facility] [employer] Crist Fat, MD

## 2014-11-16 ENCOUNTER — Ambulatory Visit: Payer: Self-pay

## 2014-11-19 ENCOUNTER — Telehealth: Payer: Self-pay | Admitting: Urology

## 2014-11-19 DIAGNOSIS — N151 Renal and perinephric abscess: Secondary | ICD-10-CM

## 2014-11-19 NOTE — Telephone Encounter (Signed)
Pt called & LM on voice mail this afternoon, is returning Shannon's call.  I called the patient and told her you were not in the office this afternoon & that you would return her call on Monday.

## 2014-11-21 NOTE — Telephone Encounter (Signed)
Patient needs a follow CT of the abdomen w/wo to follow a renal abscess.  She needs an appointment with one of the physicians to discuss the results. She will need a BUN + creatinine prior to the study.

## 2014-11-22 NOTE — Telephone Encounter (Signed)
Spoke with pt in reference to CT, appts, and labs. Pt voiced understanding. All orders placed.

## 2014-11-23 ENCOUNTER — Emergency Department: Payer: Self-pay

## 2014-11-23 ENCOUNTER — Encounter: Payer: Self-pay | Admitting: Urgent Care

## 2014-11-23 ENCOUNTER — Other Ambulatory Visit: Payer: Self-pay

## 2014-11-23 ENCOUNTER — Emergency Department
Admission: EM | Admit: 2014-11-23 | Discharge: 2014-11-23 | Disposition: A | Discharge status: Home / Self Care | Payer: Self-pay | Attending: Emergency Medicine | Admitting: Emergency Medicine

## 2014-11-23 DIAGNOSIS — N151 Renal and perinephric abscess: Secondary | ICD-10-CM

## 2014-11-23 DIAGNOSIS — Z7951 Long term (current) use of inhaled steroids: Secondary | ICD-10-CM | POA: Insufficient documentation

## 2014-11-23 DIAGNOSIS — Z7982 Long term (current) use of aspirin: Secondary | ICD-10-CM | POA: Insufficient documentation

## 2014-11-23 DIAGNOSIS — N119 Chronic tubulo-interstitial nephritis, unspecified: Secondary | ICD-10-CM | POA: Insufficient documentation

## 2014-11-23 DIAGNOSIS — F17210 Nicotine dependence, cigarettes, uncomplicated: Secondary | ICD-10-CM | POA: Insufficient documentation

## 2014-11-23 DIAGNOSIS — Z79899 Other long term (current) drug therapy: Secondary | ICD-10-CM | POA: Insufficient documentation

## 2014-11-23 DIAGNOSIS — R109 Unspecified abdominal pain: Secondary | ICD-10-CM

## 2014-11-23 DIAGNOSIS — E119 Type 2 diabetes mellitus without complications: Secondary | ICD-10-CM | POA: Insufficient documentation

## 2014-11-23 DIAGNOSIS — Z794 Long term (current) use of insulin: Secondary | ICD-10-CM | POA: Insufficient documentation

## 2014-11-23 LAB — COMPREHENSIVE METABOLIC PANEL
ALBUMIN: 4 g/dL (ref 3.5–5.0)
ALK PHOS: 92 U/L (ref 38–126)
ALT: 15 U/L (ref 14–54)
ANION GAP: 10 (ref 5–15)
AST: 18 U/L (ref 15–41)
BILIRUBIN TOTAL: 0.3 mg/dL (ref 0.3–1.2)
BUN: 22 mg/dL — ABNORMAL HIGH (ref 6–20)
CALCIUM: 9.5 mg/dL (ref 8.9–10.3)
CO2: 23 mmol/L (ref 22–32)
Chloride: 106 mmol/L (ref 101–111)
Creatinine, Ser: 0.96 mg/dL (ref 0.44–1.00)
GFR calc non Af Amer: >60 mL/min (ref >60)
GLUCOSE: 186 mg/dL — AB (ref 65–99)
POTASSIUM: 4.4 mmol/L (ref 3.5–5.1)
Sodium: 139 mmol/L (ref 135–145)
TOTAL PROTEIN: 7.9 g/dL (ref 6.5–8.1)

## 2014-11-23 LAB — URINALYSIS COMPLETE WITH MICROSCOPIC (ARMC ONLY)
BILIRUBIN URINE: NEGATIVE
Bacteria, UA: NONE SEEN
Glucose, UA: NEGATIVE mg/dL
KETONES UR: NEGATIVE mg/dL
Nitrite: NEGATIVE
PH: 6 (ref 5.0–8.0)
Protein, ur: 100 mg/dL — AB
Specific Gravity, Urine: 1.036 — ABNORMAL HIGH (ref 1.005–1.030)

## 2014-11-23 LAB — CBC WITH DIFFERENTIAL/PLATELET
BASOS ABS: 0.1 10*3/uL (ref 0–0.1)
BASOS PCT: 1 %
EOS ABS: 0.6 10*3/uL (ref 0–0.7)
Eosinophils Relative: 4 %
HEMATOCRIT: 39.4 % (ref 35.0–47.0)
HEMOGLOBIN: 13.3 g/dL (ref 12.0–16.0)
Lymphocytes Relative: 36 %
Lymphs Abs: 4.8 10*3/uL — ABNORMAL HIGH (ref 1.0–3.6)
MCH: 29.4 pg (ref 26.0–34.0)
MCHC: 33.7 g/dL (ref 32.0–36.0)
MCV: 87.2 fL (ref 80.0–100.0)
Monocytes Absolute: 1.3 10*3/uL — ABNORMAL HIGH (ref 0.2–0.9)
Monocytes Relative: 9 %
NEUTROS ABS: 6.7 10*3/uL — AB (ref 1.4–6.5)
NEUTROS PCT: 50 %
PLATELETS: 346 10*3/uL (ref 150–440)
RBC: 4.52 MIL/uL (ref 3.80–5.20)
RDW: 16.6 % — ABNORMAL HIGH (ref 11.5–14.5)
WBC: 13.3 10*3/uL — AB (ref 3.6–11.0)

## 2014-11-23 LAB — LIPASE, BLOOD: Lipase: 24 U/L (ref 11–51)

## 2014-11-23 LAB — LACTIC ACID, PLASMA: Lactic Acid, Venous: 1.9 mmol/L (ref 0.5–2.0)

## 2014-11-23 MED ORDER — HYDROMORPHONE HCL 1 MG/ML IJ SOLN
1.0000 mg | INTRAMUSCULAR | Status: AC
  Administered 2014-11-23: 1 mg via INTRAVENOUS
  Filled 2014-11-23: qty 1

## 2014-11-23 MED ORDER — HYDROCODONE-ACETAMINOPHEN 5-325 MG PO TABS: 1.0000 | ORAL_TABLET | ORAL | Status: DC | PRN

## 2014-11-23 MED ORDER — SODIUM CHLORIDE 0.9 % IV BOLUS (SEPSIS)
1000.0000 mL | INTRAVENOUS | Status: AC
  Administered 2014-11-23: 1000 mL via INTRAVENOUS
  Filled 2014-11-23: qty 1000

## 2014-11-23 MED ORDER — DOCUSATE SODIUM 100 MG PO CAPS: ORAL_CAPSULE | ORAL | Status: DC

## 2014-11-23 MED ORDER — SULFAMETHOXAZOLE-TRIMETHOPRIM 800-160 MG PO TABS
1.0000 | ORAL_TABLET | Freq: Once | ORAL | Status: AC
  Administered 2014-11-23: 1 via ORAL
  Filled 2014-11-23: qty 1

## 2014-11-23 MED ORDER — ONDANSETRON HCL 4 MG PO TABS: ORAL_TABLET | ORAL | Status: DC

## 2014-11-23 MED ORDER — IOHEXOL 350 MG/ML SOLN
100.0000 mL | Freq: Once | INTRAVENOUS | Status: AC | PRN
  Administered 2014-11-23: 100 mL via INTRAVENOUS

## 2014-11-23 MED ORDER — SULFAMETHOXAZOLE-TRIMETHOPRIM 800-160 MG PO TABS: 1.0000 | ORAL_TABLET | Freq: Two times a day (BID) | ORAL | Status: DC

## 2014-11-23 NOTE — ED Provider Notes (Signed)
[facility] Emergency Department Provider Note  ____________________________________________  Time seen: Approximately 8:14 PM  I have reviewed the triage vital signs and the nursing notes.   HISTORY  Chief Complaint Flank Pain    HPI Rose Luna is a 59 y.o. female presents with left-sided lower back pain and left flank pain.  She has been seen recently and diagnosed with pyelonephritis on the left side, a small renal abscess, and a large intrarenal stone for which she is being evaluated by urology for possible surgery.  She also has a stent on the left side.  She had a urinalysis several days ago that demonstrated both Klebsiella and Escherichia coli.  She has been taking antibiotics but she cannot room which one.  She Finished the antibiotics several days ago.  She had acute worsening of the pain today.  She describes it as sharp, stabbing, nothing makes it better, and movement makes it worse.  She has also had some nausea and vomiting.  She denies fever/chills, chest pain, shortness of breath.   Past Medical History  Diagnosis Date  . Diabetes mellitus without complication (HCC)   . Type 2 diabetes mellitus (HCC) 04/18/2009  . Acute cystitis 09/19/2014  . Injury of kidney 09/19/2014  . Uremia 09/19/2014  . Infection of urinary tract 09/19/2014  . Elevated erythrocyte sedimentation rate 09/22/2014  . HLD (hyperlipidemia) 04/19/2009  . Asthma   . History of renal stent   . Atrial fibrillation [facility])     Patient Active Problem List   Diagnosis Date Noted  . Retained ureteral stent 11/03/2014  . Renal abscess 11/03/2014  . Elevated erythrocyte sedimentation rate 09/22/2014  . Acute cystitis 09/19/2014  . Injury of kidney 09/19/2014  . Hypomagnesemia 09/19/2014  . Below normal amount of sodium in the blood 09/19/2014  . Elevated WBC count 09/19/2014  . Uremia 09/19/2014  . Infection of urinary tract 09/19/2014  . Current tobacco use 12/05/2011  .  Adiposity 05/08/2011  . HLD (hyperlipidemia) 04/19/2009  . Type 2 diabetes mellitus (HCC) 04/18/2009    Past Surgical History  Procedure Laterality Date  . Cesarean section    . Urethral stent    . Hysterotomy      Current Outpatient Rx  Name  Route  Sig  Dispense  Refill  . albuterol (PROVENTIL HFA;VENTOLIN HFA) 108 (90 BASE) MCG/ACT inhaler   Inhalation   Inhale 2 puffs into the lungs every 6 (six) hours as needed for wheezing or shortness of breath.         Marland Kitchen aspirin EC 81 MG tablet   Oral   Take 81 mg by mouth daily.         Marland Kitchen atorvastatin (LIPITOR) 40 MG tablet   Oral   Take 40 mg by mouth at bedtime.         . celecoxib (CELEBREX) 200 MG capsule   Oral   Take 200 mg by mouth 2 (two) times daily.         . fluticasone (FLONASE) 50 MCG/ACT nasal spray   Each Nare   Place 1 spray into both nostrils daily.         . fluticasone (FLOVENT HFA) 110 MCG/ACT inhaler   Inhalation   Inhale 1 puff into the lungs 2 (two) times daily.          Marland Kitchen gabapentin (NEURONTIN) 300 MG capsule   Oral   Take 900 mg by mouth at bedtime.         Marland Kitchen  gemfibrozil (LOPID) 600 MG tablet   Oral   Take 600 mg by mouth 2 (two) times daily.         . insulin detemir (LEVEMIR) 100 UNIT/ML injection   Subcutaneous   Inject 45 Units into the skin 2 (two) times daily.          . Liraglutide (VICTOZA) 18 MG/3ML SOPN   Subcutaneous   Inject 1.2 mg into the skin daily at 12 noon.          . metFORMIN (GLUCOPHAGE) 1000 MG tablet   Oral   Take 1,000 mg by mouth 2 (two) times daily.         . ranitidine (ZANTAC) 150 MG tablet   Oral   Take 150 mg by mouth 2 (two) times daily.         Marland Kitchen docusate sodium (COLACE) 100 MG capsule      Take 1 tablet once or twice daily as needed for constipation while taking narcotic pain medicine   30 capsule   0   . HYDROcodone-acetaminophen (NORCO/VICODIN) 5-325 MG tablet   Oral   Take 1-2 tablets by mouth every 4 (four) hours as  needed for moderate pain.   30 tablet   0   . ondansetron (ZOFRAN) 4 MG tablet      Take 1-2 tabs by mouth every 8 hours as needed for nausea/vomiting   30 tablet   0   . sulfamethoxazole-trimethoprim (BACTRIM DS,SEPTRA DS) 800-160 MG tablet   Oral   Take 1 tablet by mouth 2 (two) times daily.   20 tablet   0     Allergies Review of patient's allergies indicates no known allergies.  Family History  Problem Relation Age of Onset  . Adopted: Yes  . Other      patient adopted    Social History Social History  Substance Use Topics  . Smoking status: Current Every Day Smoker -- 1.00 packs/day    Types: Cigarettes  . Smokeless tobacco: None  . Alcohol Use: No    Review of Systems Constitutional: No fever/chills Eyes: No visual changes. ENT: No sore throat. Cardiovascular: Denies chest pain. Respiratory: Denies shortness of breath. Gastrointestinal: Severe left-sided abdominal and flank pain with some nausea and vomiting.  No diarrhea.  No constipation. Genitourinary: Negative for dysuria. Musculoskeletal: Negative for back pain. Skin: Negative for rash. Neurological: Negative for headaches, focal weakness or numbness.  10-point ROS otherwise negative.  ____________________________________________   PHYSICAL EXAM:  VITAL SIGNS: ED Triage Vitals  Enc Vitals Group     BP 11/23/14 1910 152/74 mmHg     Pulse Rate 11/23/14 1910 99     Resp 11/23/14 1910 26     Temp 11/23/14 1910 98.1 F (36.7 C)     Temp Source 11/23/14 1910 Oral     SpO2 11/23/14 1910 99 %     Weight 11/23/14 1910 225 lb (102.059 kg)     Height 11/23/14 1910 5\' 4"  (1.626 m)     Head Cir --      Peak Flow --      Pain Score 11/23/14 1911 10     Pain Loc --      Pain Edu? --      Excl. in GC? --     Constitutional: Alert but somewhat somnolent after analgesia.  Pain is well controlled after 1 mg of Dilaudid, but reportedly she was writhing around upon initial arrival. Eyes: Conjunctivae  are normal. PERRL. EOMI. Head:  Atraumatic. Nose: No congestion/rhinnorhea. Mouth/Throat: Mucous membranes are moist.  Oropharynx non-erythematous. Neck: No stridor.   Cardiovascular: Normal rate, regular rhythm. Grossly normal heart sounds.  Good peripheral circulation. Respiratory: Normal respiratory effort.  No retractions. Lungs CTAB. Gastrointestinal: Soft with mild tenderness to the left side of the abdomen. No distention. No abdominal bruits.  Mild left CVA tenderness. Musculoskeletal: No lower extremity tenderness nor edema.  No joint effusions. Neurologic:  Normal speech and language. No gross focal neurologic deficits are appreciated.  Skin:  Skin is warm, dry and intact. No rash noted.   ____________________________________________   LABS (all labs ordered are listed, but only abnormal results are displayed)  Labs Reviewed  COMPREHENSIVE METABOLIC PANEL - Abnormal; Notable for the following:    Glucose, Bld 186 (*)    BUN 22 (*)    All other components within normal limits  CBC WITH DIFFERENTIAL/PLATELET - Abnormal; Notable for the following:    WBC 13.3 (*)    RDW 16.6 (*)    Neutro Abs 6.7 (*)    Lymphs Abs 4.8 (*)    Monocytes Absolute 1.3 (*)    All other components within normal limits  URINALYSIS COMPLETEWITH MICROSCOPIC ([facility] ONLY) - Abnormal; Notable for the following:    Color, Urine YELLOW (*)    APPearance HAZY (*)    Specific Gravity, Urine 1.036 (*)    Hgb urine dipstick 2+ (*)    Protein, ur 100 (*)    Leukocytes, UA 3+ (*)    Squamous Epithelial / LPF 0-5 (*)    All other components within normal limits  CULTURE, BLOOD (ROUTINE X 2)  CULTURE, BLOOD (ROUTINE X 2)  LACTIC ACID, PLASMA  LIPASE, BLOOD  LACTIC ACID, PLASMA   ____________________________________________  EKG  Not indicated ____________________________________________  RADIOLOGY   Ct Abdomen Pelvis W Contrast  11/23/2014  CLINICAL DATA:  Low back pain radiating into the left  flank. Known kidney stone and waiting surgery. EXAM: CT ABDOMEN AND PELVIS WITH CONTRAST TECHNIQUE: Multidetector CT imaging of the abdomen and pelvis was performed using the standard protocol following bolus administration of intravenous contrast. CONTRAST:  OMNIPAQUE IOHEXOL 350 MG/ML SOLN COMPARISON:  11/03/2014 FINDINGS: Dependent atelectasis in the lung bases. Mild emphysematous changes. Liver, spleen, gallbladder, pancreas, adrenal glands, abdominal aorta, inferior vena cava, and retroperitoneal lymph nodes are unremarkable. There is a stone in the lower pole left kidney measuring 7.2 mm. Left ureteral stent remains in place with distal pigtail in the bladder and proximal pigtail in the left renal pelvis. Renal collecting system is decompressed. There is some stranding around the left kidney with heterogeneous nephrogram in the upper pole and subcapsular fluid in the upper pole. Stranding and subcapsular changes are decreasing since previous study. Appearance is consistent with improving focal pyelonephritis. Prominent but non pathologically enlarged lymph nodes in the right retroperitoneum are likely reactive. Cyst in the right kidney. No hydronephrosis or hydroureter on the right. Stomach, small bowel, and colon are not abnormally distended. Stool fills the colon. No free air or free fluid in the abdomen. Abdominal wall musculature appears intact. Pelvis: Bladder wall is not thickened. Uterus and ovaries are not enlarged. No free or loculated pelvic fluid collections. No pelvic mass or lymphadenopathy. Appendix is normal. Mild degenerative changes in the spine. No destructive bone lesions. IMPRESSION: Stone in the lower pole left kidney. Changes of focal pyelonephritis in the upper pole left kidney demonstrating improvement since prior study. Reactive lymph nodes. Left ureteral stent remains in place.  Renal collecting system is decompressed. Electronically Signed   By: Burman NievesWilliam  Stevens M.D.   On:  11/23/2014 20:47    ____________________________________________   PROCEDURES  Procedure(s) performed: None  Critical Care performed: No ____________________________________________   INITIAL IMPRESSION / ASSESSMENT AND PLAN / ED COURSE  Pertinent labs & imaging results that were available during my care of the patient were reviewed by me and considered in my medical decision making (see chart for details).  Review of the patient's past records demonstrated the history of pyelonephritis on the CT scan as well as the renal abscess.  There is a note from urology that mentions that the patient needs follow-up imaging to determine the progression or resolution of the infection/abscess.  Since she is presenting today with acute worsening of the pain in the setting of a known stent, intrarenal stone, and infection, I will proceed with reevaluation of her kidneys with IV contrast only CT of the abdomen and pelvis.  Her labs are reassuring at this time except that she does have a slight leukocytosis.  Her lactate is within normal limits but is at the upper limits at 1.9.  I ordered blood cultures given that we knew that she had a UTI though she has inferior completed treatment.  I have asked her nurse to perform an I/O catheterization to obtain a urine specimen.  ----------------------------------------- 9:51 PM on 11/23/2014 -----------------------------------------  The patient's workup is reassuring.  She has a "dirty urine", but with no bacteria seen and the dirty urine is to be infected given that she has a stent in place.  She is hemodynamically stable and other than a mild leukocytosis her labs are reassuring.  I obtained the repeat imaging 's above which shows interval improvement.  I spoke with the on-call urologist, Dr. Retta Dionesahlstedt, who agreed with my plan for pain control, continuing antibiotics, and outpatient follow-up with her existing urologist.  He recommended that instead of  continuing Cipro, which is not currently and fever in this area, that I changed her to Bactrim DS 1 tablet twice daily, which I have done.  I informed the patient of all of this and the importance of close follow-up tomorrow and she understands and agrees. ____________________________________________  FINAL CLINICAL IMPRESSION(S) / ED DIAGNOSES  Final diagnoses:  Chronic pyelonephritis  Left flank pain      NEW MEDICATIONS STARTED DURING THIS VISIT:  New Prescriptions   DOCUSATE SODIUM (COLACE) 100 MG CAPSULE    Take 1 tablet once or twice daily as needed for constipation while taking narcotic pain medicine   HYDROCODONE-ACETAMINOPHEN (NORCO/VICODIN) 5-325 MG TABLET    Take 1-2 tablets by mouth every 4 (four) hours as needed for moderate pain.   ONDANSETRON (ZOFRAN) 4 MG TABLET    Take 1-2 tabs by mouth every 8 hours as needed for nausea/vomiting   SULFAMETHOXAZOLE-TRIMETHOPRIM (BACTRIM DS,SEPTRA DS) 800-160 MG TABLET    Take 1 tablet by mouth 2 (two) times daily.     Loleta Roseory Laddie Naeem, MD 11/23/14 2217

## 2014-11-23 NOTE — Discharge Instructions (Signed)
Your workup was reassuring today with improvement of your kidney infection on CT scan.  It is important that you follow-up with your urologist at the next available opportunity including giving her a phone call tomorrow.  Be sure and let her know that you have had the repeat CT scan and lab work that she wanted you to have.  Please take the prescribed antibiotic and use the other prescribed medications as needed and prescribed.  Take Norco as prescribed for severe pain. Do not drink alcohol, drive or participate in any other potentially dangerous activities while taking this medication as it may make you sleepy. Do not take this medication with any other sedating medications, either prescription or over-the-counter. If you were prescribed Percocet or Vicodin, do not take these with acetaminophen (Tylenol) as it is already contained within these medications.   This medication is an opiate (or narcotic) pain medication and can be habit forming.  Use it as little as possible to achieve adequate pain control.  Do not use or use it with extreme caution if you have a history of opiate abuse or dependence.  If you are on a pain contract with your primary care doctor or a pain specialist, be sure to let them know you were prescribed this medication today from the [facility].  This medication is intended for your use only - do not give any to anyone else and keep it in a secure place where nobody else, especially children, have access to it.  It will also cause or worsen constipation, so you may want to consider taking an over-the-counter stool softener while you are taking this medication.  Please return immediately to the Emergency Department if you develop any new or worsening symptoms that concern you.   Pyelonephritis, Adult Pyelonephritis is a kidney infection. The kidneys are organs that help clean your blood by moving waste out of your blood and into your pee (urine). This  infection can happen quickly, or it can last for a long time. In most cases, it clears up with treatment and does not cause other problems. HOME CARE Medicines  Take over-the-counter and prescription medicines only as told by your doctor.  Take your antibiotic medicine as told by your doctor. Do not stop taking the medicine even if you start to feel better. General Instructions  Drink enough fluid to keep your pee clear or pale yellow.  Avoid caffeine, tea, and carbonated drinks.  Pee (urinate) often. Avoid holding in pee for long periods of time.  Pee before and after sex.  After pooping (having a bowel movement), women should wipe from front to back. Use each tissue only once.  Keep all follow-up visits as told by your doctor. This is important. GET HELP IF:  You do not feel better after 2 days.  Your symptoms get worse.  You have a fever. GET HELP RIGHT AWAY IF:  You cannot take your medicine or drink fluids as told.  You have chills and shaking.  You throw up (vomit).  You have very bad pain in your side (flank) or back.  You feel very weak or you pass out (faint).   This information is not intended to replace advice given to you by your health care provider. Make sure you discuss any questions you have with your health care provider.   Document Released: 01/26/2004 Document Revised: 09/08/2014 Document Reviewed: 04/12/2014 Elsevier Interactive Patient Education [employer]2016 [employer] Inc[employer]

## 2014-11-23 NOTE — ED Notes (Signed)
Patient presents with lower back pain with (+) radiation into LEFT flank. Patient reports that she has a known kidney stone is "awaiting surgery." Patient in obvious discomfort in triage; tearful. Denies N/V.

## 2014-11-24 ENCOUNTER — Telehealth: Payer: Self-pay | Admitting: Urology

## 2014-11-24 LAB — BUN+CREAT
BUN / CREAT RATIO: 20 (ref 9–23)
BUN: 18 mg/dL (ref 6–24)
Creatinine, Ser: 0.91 mg/dL (ref 0.57–1.00)
GFR, EST AFRICAN AMERICAN: 81 mL/min/{1.73_m2} (ref >59)
GFR, EST NON AFRICAN AMERICAN: 70 mL/min/{1.73_m2} (ref >59)

## 2014-11-24 NOTE — Telephone Encounter (Signed)
Patient is scheduled to see me on the 28th.  She needs to see one of the physicians.  Please make the necessary arrangements.

## 2014-11-28 LAB — CULTURE, BLOOD (ROUTINE X 2)
Culture: NO GROWTH
Culture: NO GROWTH

## 2014-11-29 ENCOUNTER — Encounter: Payer: Self-pay | Admitting: Urology

## 2014-11-29 ENCOUNTER — Ambulatory Visit: Payer: Self-pay | Admitting: Urology

## 2014-11-29 ENCOUNTER — Ambulatory Visit (INDEPENDENT_AMBULATORY_CARE_PROVIDER_SITE_OTHER): Payer: Self-pay | Admitting: Urology

## 2014-11-29 VITALS — BP 135/73 | HR 74 | Ht 64.0 in | Wt 230.7 lb

## 2014-11-29 DIAGNOSIS — R109 Unspecified abdominal pain: Secondary | ICD-10-CM

## 2014-11-29 DIAGNOSIS — N151 Renal and perinephric abscess: Secondary | ICD-10-CM

## 2014-11-29 DIAGNOSIS — N200 Calculus of kidney: Secondary | ICD-10-CM

## 2014-11-29 LAB — MICROSCOPIC EXAMINATION

## 2014-11-29 LAB — URINALYSIS, COMPLETE
BILIRUBIN UA: NEGATIVE
GLUCOSE, UA: NEGATIVE
KETONES UA: NEGATIVE
Nitrite, UA: NEGATIVE
SPEC GRAV UA: 1.01 (ref 1.005–1.030)
Urobilinogen, Ur: 0.2 mg/dL (ref 0.2–1.0)
pH, UA: 6 (ref 5.0–7.5)

## 2014-11-29 NOTE — Progress Notes (Signed)
11/29/2014 1:30 PM   Rose Luna [birth year] [id number]  Referring provider: [facility] [address] [place], [place] [address]  No chief complaint on file.   HPI: 59 year old female with poorly controlled diabetes who presented to the emergency department for evaluation of her tachycardia from the urology clinic where she was being seen for further management of her left nonobstructing renal stone.  The patient's current episode began in mid September while she was visiting her daughter in [place]. There she presented to the emergency department and septic shock with DKA secondary to an infected left proximal ureteral stone. She underwent a left ureteral stent on 09/19/14. Her hospital course included an ICU stay for hemodynamic instability. Her creatinine rose to 4.5. The patient was found to have A. fib with RVR. She was started on amiodarone and spontaneously converted back to sinus rhythm. She also she was found to have Escherichia coli bacteremia which was resistant to penicillin but sensitive to everything else. She was discharged home on Bactrim, received a total of 14 days of antibiotics. Prior to the patient's presentation, she had been off of antibiotics for at least 2 weeks. She denies any flank pain, fevers, or significant voiding symptoms including dysuria. She has had some difficulty controlling her blood sugars recently. A CT scan done in the emergency department prior to this admission demonstrated small micro-abscesses in the left kidney with a dominant one measuring 2.7 cm. There was air within the collecting system, no parenchymal air. There was no significant hydronephrosis.  Interval history: The patient returns to the office after being seen as an inpatient for her left renal abscess. Repeat CT imaging shows that her renal abscess is resolving. She still has a 7 mm lower pole stone as well as her left ureteral stent and  will need definitive stone  management. Her urine cultures at the time of her renal abscess grew klebsiella and Escherichia coli both sensitive to Bactrim. She is currently on a 10 day course of Bactrim after presenting to the hospital with left flank pain approximately 6 days ago. She denies any other symptoms this time. She has had no recent fevers, chills, nausea, or vomiting. Her biggest complaint is time is for restless legs.  PMH: Past Medical History  Diagnosis Date  . Diabetes mellitus without complication (HCC)   . Type 2 diabetes mellitus (HCC) 04/18/2009  . Acute cystitis 09/19/2014  . Injury of kidney 09/19/2014  . Uremia 09/19/2014  . Infection of urinary tract 09/19/2014  . Elevated erythrocyte sedimentation rate 09/22/2014  . HLD (hyperlipidemia) 04/19/2009  . Asthma   . History of renal stent   . Atrial fibrillation [facility])     Surgical History: Past Surgical History  Procedure Laterality Date  . Cesarean section    . Urethral stent    . Hysterotomy      Home Medications:    Medication List       This list is accurate as of: 11/29/14  1:30 PM.  Always use your most recent med list.               albuterol 108 (90 BASE) MCG/ACT inhaler  Commonly known as:  PROVENTIL HFA;VENTOLIN HFA  Inhale 2 puffs into the lungs every 6 (six) hours as needed for wheezing or shortness of breath.     aspirin EC 81 MG tablet  Take 81 mg by mouth daily.     atorvastatin 40 MG tablet  Commonly known as:  LIPITOR  Take 40 mg by mouth at bedtime.     celecoxib 200 MG capsule  Commonly known as:  CELEBREX  Take 200 mg by mouth 2 (two) times daily.     docusate sodium 100 MG capsule  Commonly known as:  COLACE  Take 1 tablet once or twice daily as needed for constipation while taking narcotic pain medicine     fluticasone 110 MCG/ACT inhaler  Commonly known as:  FLOVENT HFA  Inhale 1 puff into the lungs 2 (two) times daily.     fluticasone 50 MCG/ACT nasal spray  Commonly known as:  FLONASE  Place 1  spray into both nostrils daily.     gabapentin 300 MG capsule  Commonly known as:  NEURONTIN  Take 900 mg by mouth at bedtime.     gemfibrozil 600 MG tablet  Commonly known as:  LOPID  Take 600 mg by mouth 2 (two) times daily.     HYDROcodone-acetaminophen 5-325 MG tablet  Commonly known as:  NORCO/VICODIN  Take 1-2 tablets by mouth every 4 (four) hours as needed for moderate pain.     insulin detemir 100 UNIT/ML injection  Commonly known as:  LEVEMIR  Inject 45 Units into the skin 2 (two) times daily.     metFORMIN 1000 MG tablet  Commonly known as:  GLUCOPHAGE  Take 1,000 mg by mouth 2 (two) times daily.     ondansetron 4 MG tablet  Commonly known as:  ZOFRAN  Take 1-2 tabs by mouth every 8 hours as needed for nausea/vomiting     ranitidine 150 MG tablet  Commonly known as:  ZANTAC  Take 150 mg by mouth 2 (two) times daily.     sulfamethoxazole-trimethoprim 800-160 MG tablet  Commonly known as:  BACTRIM DS,SEPTRA DS  Take 1 tablet by mouth 2 (two) times daily.     VICTOZA 18 MG/3ML Sopn  Generic drug:  Liraglutide  Inject 1.2 mg into the skin daily at 12 noon.        Allergies: No Known Allergies  Family History: Family History  Problem Relation Age of Onset  . Adopted: Yes  . Other      patient adopted    Social History:  reports that she has been smoking Cigarettes.  She has been smoking about 1.00 pack per day. She does not have any smokeless tobacco history on file. She reports that she does not drink alcohol. Her drug history is not on file.  ROS:                                        Physical Exam: There were no vitals taken for this visit.  Constitutional:  Alert and oriented, No acute distress. HEENT: [place] AT, moist mucus membranes.  Trachea midline, no masses. Cardiovascular: No clubbing, cyanosis, or edema. Respiratory: Normal respiratory effort, no increased work of breathing. GI: Abdomen is soft, nontender,  nondistended, no abdominal masses GU: No CVA tenderness.  Skin: No rashes, bruises or suspicious lesions. Lymph: No cervical or inguinal adenopathy. Neurologic: Grossly intact, no focal deficits, moving all 4 extremities. Psychiatric: Normal mood and affect.  Laboratory Data: Lab Results  Component Value Date   WBC 13.3* 11/23/2014   HGB 13.3 11/23/2014   HCT 39.4 11/23/2014   MCV 87.2 11/23/2014   PLT 346 11/23/2014    Lab Results  Component Value Date   CREATININE  0.96 11/23/2014    No results found for: PSA  No results found for: TESTOSTERONE  No results found for: HGBA1C  Urinalysis    Component Value Date/Time   COLORURINE YELLOW* 11/23/2014 2109   APPEARANCEUR HAZY* 11/23/2014 2109   LABSPEC 1.036* 11/23/2014 2109   PHURINE 6.0 11/23/2014 2109   GLUCOSEU NEGATIVE 11/23/2014 2109   HGBUR 2+* 11/23/2014 2109   BILIRUBINUR NEGATIVE 11/23/2014 2109   KETONESUR NEGATIVE 11/23/2014 2109   PROTEINUR 100* 11/23/2014 2109   NITRITE NEGATIVE 11/23/2014 2109   LEUKOCYTESUR 3+* 11/23/2014 2109    Pertinent Imaging: IMPRESSION: Stone in the lower pole left kidney. Changes of focal pyelonephritis in the upper pole left kidney demonstrating improvement since prior study. Reactive lymph nodes. Left ureteral stent remains in place. Renal collecting system is decompressed.   Assessment & Plan:    I discussed the various surgical approaches for treating a lower pole renal stone. Her to best options this point would be ESWL or left ureteroscopy. I discussed with the patient that she has a higher chance of a stone free rate after one procedure with left ureteroscopy. I discussed the risks, benefits, indications of ureteroscopy. She is aware the risks include, but are not limited to, bleeding, infection, and inability to be stone free after one procedure. She is agreeable to proceeding with this procedure. All questions were answered. We will schedule this in 2-3 weeks to allow  full resolution of her abscess which appears to be healing well.  1. Left nonobstructing lower pole 7 mm ureteral stone -Cystoscopy, left ureteroscopy, left retropyelogram, laser lithotripsy, left ureteral stent exchange  2. Left renal abscess -Resolving     Hildred Laser, MD  [facility] [address] [place], [place] [address] [phone]

## 2014-12-01 LAB — CULTURE, URINE COMPREHENSIVE

## 2014-12-03 ENCOUNTER — Telehealth: Payer: Self-pay | Admitting: Radiology

## 2014-12-03 NOTE — Telephone Encounter (Signed)
Pt notified of surgery r/s to 01/19/15, pre-admit testing appt 01/07/15 @9 :45 and to call day prior to surgery for arrival time to SDS. Surgery and Pre-Admit Testing Information Sheet mailed to pt's home address. Pt voices understanding.

## 2014-12-07 ENCOUNTER — Ambulatory Visit: Payer: Self-pay | Admitting: Cardiovascular Disease

## 2014-12-14 ENCOUNTER — Other Ambulatory Visit: Payer: Self-pay

## 2015-01-04 ENCOUNTER — Ambulatory Visit (INDEPENDENT_AMBULATORY_CARE_PROVIDER_SITE_OTHER): Payer: Self-pay | Admitting: Cardiovascular Disease

## 2015-01-04 ENCOUNTER — Ambulatory Visit: Payer: Self-pay | Admitting: Cardiovascular Disease

## 2015-01-04 ENCOUNTER — Encounter: Payer: Self-pay | Admitting: Cardiovascular Disease

## 2015-01-04 VITALS — BP 134/68 | HR 86 | Ht 64.0 in | Wt 228.0 lb

## 2015-01-04 DIAGNOSIS — E785 Hyperlipidemia, unspecified: Secondary | ICD-10-CM

## 2015-01-04 DIAGNOSIS — Z01810 Encounter for preprocedural cardiovascular examination: Secondary | ICD-10-CM

## 2015-01-04 DIAGNOSIS — I2510 Atherosclerotic heart disease of native coronary artery without angina pectoris: Secondary | ICD-10-CM

## 2015-01-04 DIAGNOSIS — E1159 Type 2 diabetes mellitus with other circulatory complications: Secondary | ICD-10-CM

## 2015-01-04 DIAGNOSIS — I739 Peripheral vascular disease, unspecified: Secondary | ICD-10-CM

## 2015-01-04 DIAGNOSIS — Z720 Tobacco use: Secondary | ICD-10-CM

## 2015-01-04 DIAGNOSIS — E669 Obesity, unspecified: Secondary | ICD-10-CM

## 2015-01-04 DIAGNOSIS — I700 Atherosclerosis of aorta: Secondary | ICD-10-CM

## 2015-01-04 MED ORDER — METOPROLOL TARTRATE 25 MG PO TABS: 25.0000 mg | ORAL_TABLET | Freq: Two times a day (BID) | ORAL | Status: DC

## 2015-01-04 NOTE — Assessment & Plan Note (Signed)
Goal LDL less than 70 Recommend she continue on her Lipitor

## 2015-01-04 NOTE — Assessment & Plan Note (Signed)
Unable to exclude coronary disease in the left circumflex on CT scan Recommended smoking cessation, stay on her statin

## 2015-01-04 NOTE — Patient Instructions (Addendum)
You are doing well.  Please take metoprolol one pill twice a day for heart rate control, to prevent atrial fibrillation Start now and continue the medication until all the kidney stone issues have resolved  Please call us if you have new issues that need to be addressed before your next appt.  Follow up in 1 year      Steps to Quit Smoking  Smoking tobacco can be harmful to your health and can affect almost every organ in your body. Smoking puts you, and those around you, at risk for developing many serious chronic diseases. Quitting smoking is difficult, but it is one of the best things that you can do for your health. It is never too late to quit. WHAT ARE THE BENEFITS OF QUITTING SMOKING? When you quit smoking, you lower your risk of developing serious diseases and conditions, such as:  Lung cancer or lung disease, such as COPD.  Heart disease.  Stroke.  Heart attack.  Infertility.  Osteoporosis and bone fractures. Additionally, symptoms such as coughing, wheezing, and shortness of breath may get better when you quit. You may also find that you get sick less often because your body is stronger at fighting off colds and infections. If you are pregnant, quitting smoking can help to reduce your chances of having a baby of low birth weight. HOW DO I GET READY TO QUIT? When you decide to quit smoking, create a plan to make sure that you are successful. Before you quit:  Pick a date to quit. Set a date within the next two weeks to give you time to prepare.  Write down the reasons why you are quitting. Keep this list in places where you will see it often, such as on your bathroom mirror or in your car or wallet.  Identify the people, places, things, and activities that make you want to smoke (triggers) and avoid them. Make sure to take these actions:  Throw away all cigarettes at home, at work, and in your car.  Throw away smoking accessories, such as Camera operator.  Clean your car and make sure to empty the ashtray.  Clean your home, including curtains and carpets.  Tell your family, friends, and coworkers that you are quitting. Support from your loved ones can make quitting easier.  Talk with your health care provider about your options for quitting smoking.  Find out what treatment options are covered by your health insurance. WHAT STRATEGIES CAN I USE TO QUIT SMOKING?  Talk with your healthcare provider about different strategies to quit smoking. Some strategies include:  Quitting smoking altogether instead of gradually lessening how much you smoke over a period of time. Research shows that quitting "cold [place]" is more successful than gradually quitting.  Attending in-person counseling to help you build problem-solving skills. You are more likely to have success in quitting if you attend several counseling sessions. Even short sessions of 10 minutes can be effective.  Finding resources and support systems that can help you to quit smoking and remain smoke-free after you quit. These resources are most helpful when you use them often. They can include:  Online chats with a Veterinary surgeon.  Telephone quitlines.  Printed Materials engineer.  Support groups or group counseling.  Text messaging programs.  Mobile phone applications.  Taking medicines to help you quit smoking. (If you are pregnant or breastfeeding, talk with your health care provider first.) Some medicines contain nicotine and some do not. Both types of medicines help  with cravings, but the medicines that include nicotine help to relieve withdrawal symptoms. Your health care provider may recommend:  Nicotine patches, gum, or lozenges.  Nicotine inhalers or sprays.  Non-nicotine medicine that is taken by mouth. Talk with your health care provider about combining strategies, such as taking medicines while you are also receiving in-person counseling. Using these two  strategies together makes you more likely to succeed in quitting than if you used either strategy on its own. If you are pregnant or breastfeeding, talk with your health care provider about finding counseling or other support strategies to quit smoking. Do not take medicine to help you quit smoking unless told to do so by your health care provider. WHAT THINGS CAN I DO TO MAKE IT EASIER TO QUIT? Quitting smoking might feel overwhelming at first, but there is a lot that you can do to make it easier. Take these important actions:  Reach out to your family and friends and ask that they support and encourage you during this time. Call telephone quitlines, reach out to support groups, or work with a counselor for support.  Ask people who smoke to avoid smoking around you.  Avoid places that trigger you to smoke, such as bars, parties, or smoke-break areas at work.  Spend time around people who do not smoke.  Lessen stress in your life, because stress can be a smoking trigger for some people. To lessen stress, try:  Exercising regularly.  Deep-breathing exercises.  Yoga.  Meditating.  Performing a body scan. This involves closing your eyes, scanning your body from head to toe, and noticing which parts of your body are particularly tense. Purposefully relax the muscles in those areas.  Download or purchase mobile phone or tablet apps (applications) that can help you stick to your quit plan by providing reminders, tips, and encouragement. There are many free apps, such as QuitGuide from the [employer] developer for Disease Control and Prevention). You can find other support for quitting smoking (smoking cessation) through smokefree.gov and other websites. HOW WILL I FEEL WHEN I QUIT SMOKING? Within the first 24 hours of quitting smoking, you may start to feel some withdrawal symptoms. These symptoms are usually most noticeable 2-3 days after quitting, but they usually do not last beyond 2-3 weeks. Changes  or symptoms that you might experience include:  Mood swings.  Restlessness, anxiety, or irritation.  Difficulty concentrating.  Dizziness.  Strong cravings for sugary foods in addition to nicotine.  Mild weight gain.  Constipation.  Nausea.  Coughing or a sore throat.  Changes in how your medicines work in your body.  A depressed mood.  Difficulty sleeping (insomnia). After the first 2-3 weeks of quitting, you may start to notice more positive results, such as:  Improved sense of smell and taste.  Decreased coughing and sore throat.  Slower heart rate.  Lower blood pressure.  Clearer skin.  The ability to breathe more easily.  Fewer sick days. Quitting smoking is very challenging for most people. Do not get discouraged if you are not successful the first time. Some people need to make many attempts to quit before they achieve long-term success. Do your best to stick to your quit plan, and talk with your health care provider if you have any questions or concerns.   This information is not intended to replace advice given to you by your health care provider. Make sure you discuss any questions you have with your health care provider.   Document Released: 12/12/2000  Document Revised: 05/04/2014 Document Reviewed: 05/04/2014 Elsevier Interactive Patient Education 2016 [employer]  Smoking Hazards Smoking cigarettes is extremely bad for your health. Tobacco smoke has over 200 known poisons in it. It contains the poisonous gases nitrogen oxide and carbon monoxide. There are over 60 chemicals in tobacco smoke that cause cancer. Some of the chemicals found in cigarette smoke include:   Cyanide.   Benzene.   Formaldehyde.   Methanol (wood alcohol).   Acetylene (fuel used in welding torches).   Ammonia.  Even smoking lightly shortens your life expectancy by several years. You can greatly reduce the risk of medical problems for you and your family by  stopping now. Smoking is the most preventable cause of death and disease in our society. Within days of quitting smoking, your circulation improves, you decrease the risk of having a heart attack, and your lung capacity improves. There may be some increased phlegm in the first few days after quitting, and it may take months for your lungs to clear up completely. Quitting for 10 years reduces your risk of developing lung cancer to almost that of a nonsmoker.  WHAT ARE THE RISKS OF SMOKING? Cigarette smokers have an increased risk of many serious medical problems, including:  Lung cancer.   Lung disease (such as pneumonia, bronchitis, and emphysema).   Heart attack and chest pain due to the heart not getting enough oxygen (angina).   Heart disease and peripheral blood vessel disease.   Hypertension.   Stroke.   Oral cancer (cancer of the lip, mouth, or voice box).   Bladder cancer.   Pancreatic cancer.   Cervical cancer.   Pregnancy complications, including premature birth.   Stillbirths and smaller newborn babies, birth defects, and genetic damage to sperm.   Early menopause.   Lower estrogen level for women.   Infertility.   Facial wrinkles.   Blindness.   Increased risk of broken bones (fractures).   Senile dementia.   Stomach ulcers and internal bleeding.   Delayed wound healing and increased risk of complications during surgery. Because of secondhand smoke exposure, children of smokers have an increased risk of the following:   Sudden infant death syndrome (SIDS).   Respiratory infections.   Lung cancer.   Heart disease.   Ear infections.  WHY IS SMOKING ADDICTIVE? Nicotine is the chemical agent in tobacco that is capable of causing addiction or dependence. When you smoke and inhale, nicotine is absorbed rapidly into the bloodstream through your lungs. Both inhaled and noninhaled nicotine may be addictive.  WHAT ARE THE BENEFITS OF  QUITTING?  There are many health benefits to quitting smoking. Some are:   The likelihood of developing cancer and heart disease decreases. Health improvements are seen almost immediately.   Blood pressure, pulse rate, and breathing patterns start returning to normal soon after quitting.   People who quit may see an improvement in their overall quality of life.  HOW DO YOU QUIT SMOKING? Smoking is an addiction with both physical and psychological effects, and longtime habits can be hard to change. Your health care provider can recommend:  Programs and community resources, which may include group support, education, or therapy.  Replacement products, such as patches, gum, and nasal sprays. Use these products only as directed. Do not replace cigarette smoking with electronic cigarettes (commonly called e-cigarettes). The safety of e-cigarettes is unknown, and some may contain harmful chemicals. FOR MORE INFORMATION  American Lung Association: www.lung.org  American Cancer Society: www.cancer.org   This  information is not intended to replace advice given to you by your health care provider. Make sure you discuss any questions you have with your health care provider.    Secondhand Smoke WHAT IS SECONDHAND SMOKE? Secondhand smoke is smoke that comes from burning tobacco. It could be the smoke from a cigarette, a pipe, or a cigar. Even if you are not the one smoking, secondhand smoke exposes you to the dangers of smoking. This is called involuntary, or passive, smoking. There are two types of secondhand smoke:  Sidestream smoke is the smoke that comes off the lighted end of a cigarette, pipe, or cigar.  This type of smoke has the highest amount of cancer-causing agents (carcinogens).  The particles in sidestream smoke are smaller. They get into your lungs more easily.  Mainstream smoke is the smoke that is exhaled by a person who is smoking.  This type of smoke is also dangerous to  your health. HOW CAN SECONDHAND SMOKE AFFECT MY HEALTH? Studies show that there is no safe level of secondhand smoke. This smoke contains thousands of chemicals. At least 69 of them are known to cause cancer. Secondhand smoke can also cause many other health problems. It has been linked to:  Lung cancer.  Cancer of the voice box (larynx) or throat.  Cancer of the sinuses.  Brain cancer.  Bladder cancer.  Stomach cancer.  Breast cancer.  White blood cell cancers (lymphoma and leukemia).  Brain and liver tumors in children.  Heart disease and stroke in adults.  Pregnancy loss (miscarriage).  Diseases in children, such as:  Asthma.  Lung infections.  Ear infections.  Sudden infant death syndrome (SIDS).  Slow growth. WHERE CAN I BE AT RISK FOR EXPOSURE TO SECONDHAND SMOKE?   For adults, the workplace is the main source of exposure to secondhand smoke.  Your workplace should have a policy separating smoking areas from nonsmoking areas.  Smoking areas should have a system for ventilating and cleaning the air.  For children, the home may be the most dangerous place for exposure to secondhand smoke.  Children who live in apartment buildings may be at risk from smoke drifting from hallways or other people's homes.  For everyone, many public places are possible sources of exposure to secondhand smoke.  These places include restaurants, shopping centers, and parks. HOW CAN I REDUCE MY RISK FOR EXPOSURE TO SECONDHAND SMOKE? The most important thing you can do is not smoke. Discourage family members from smoking. Other ways to reduce exposure for you and your family include the following:  Keep your home smoke free.  Make sure your child care providers do not smoke.  Warn your child about the dangers of smoking and secondhand smoke.  Do not allow smoking in your car. When someone smokes in a car, all the damaging chemicals from the smoke are confined in a small  area.  Avoid public places where smoking is allowed.   This information is not intended to replace advice given to you by your health care provider. Make sure you discuss any questions you have with your health care provider.   Document Released: 01/26/2004 Document Revised: 01/08/2014 Document Reviewed: 04/03/2013 Elsevier Interactive Patient Education [employer]

## 2015-01-04 NOTE — Assessment & Plan Note (Signed)
Moderate aortic plaquing noted as well as atherosclerosis bilateral iliac arteries Recommended smoking cessation

## 2015-01-04 NOTE — Assessment & Plan Note (Addendum)
Acceptable risk for upcoming urologic procedure. Currently with no symptoms of angina, normal EKG, normal blood pressure. No further testing needed We'll start metoprolol 25 mg twice a day given history of recent atrial fibrillation. She is currently in normal sinus rhythm

## 2015-01-04 NOTE — Assessment & Plan Note (Addendum)
CT scan images reviewed by myself in detail Moderate diffuse aortic atherosclerosis, most notable in the abdominal and pelvic region Recommended smoking cessation

## 2015-01-04 NOTE — Assessment & Plan Note (Signed)
Stressed importance of close follow-up with her primary care physician for aggressive diabetes control

## 2015-01-04 NOTE — Progress Notes (Signed)
Patient ID: Rose Luna, female    DOB: 06/08/1955, 60 y.o.   MRN: [id number]  HPI Comments: 60 year old female with poorly controlled diabetes who presented to the emergency department with  septic shock/DKA secondary to an infected left proximal ureteral stone. She underwent a left ureteral stent on 09/19/14, hospital course with hemodynamic instability, creatinine Rose to 4.5, developed  A. fib with RVR, started on amiodarone and spontaneously converted back to sinus rhythm, developed Escherichia coli bacteremia, discharged home on Bactrim. [facility] for [facility] evaluation prior to procedure for  left nonobstructing renal stone.  She denies any shortness of breath or tachycardia concerning for arrhythmia It does not appear that she was discharged on amiodarone oral beta blocker Echocardiogram in the hospital showed normal LV systolic function She continues to smoke one pack per day No mention of cardiac disease or coronary findings on recent CT scans She denies any symptoms concerning for angina Continues to have trouble with her sugars, poor diet She denies any chills, fever, nausea, vomiting  EKG on today's visit shows normal sinus rhythm with rate 86 bpm, no significant ST or T-wave changes  Other past medical history CT scan  demonstrated small micro-abscesses in the left kidney with a dominant one measuring 2.7 cm. There was air within the collecting system, no parenchymal air. There was no significant hydronephrosis.  Repeat CT imaging shows that her renal abscess was resolving.  She still has a 7 mm lower pole stone as well as her left ureteral stent  Her urine cultures from renal abscess grew klebsiella and Escherichia coli both sensitive to Bactrim.     No Known Allergies  Current Outpatient Prescriptions on File Prior to Visit  Medication Sig Dispense Refill  . atorvastatin (LIPITOR) 40 MG tablet Take 40 mg by mouth at  bedtime.    . fluticasone (FLOVENT HFA) 110 MCG/ACT inhaler Inhale 1 puff into the lungs 2 (two) times daily.     Marland Kitchen. gabapentin (NEURONTIN) 300 MG capsule Take 900 mg by mouth at bedtime.    Marland Kitchen. gemfibrozil (LOPID) 600 MG tablet Take 600 mg by mouth 2 (two) times daily.    Marland Kitchen. HYDROcodone-acetaminophen (NORCO/VICODIN) 5-325 MG tablet Take 1-2 tablets by mouth every 4 (four) hours as needed for moderate pain. 30 tablet 0  . insulin detemir (LEVEMIR) 100 UNIT/ML injection Inject 45 Units into the skin 2 (two) times daily.     . Liraglutide (VICTOZA) 18 MG/3ML SOPN Inject 1.2 mg into the skin daily at 12 noon.     . metFORMIN (GLUCOPHAGE) 1000 MG tablet Take 1,000 mg by mouth 2 (two) times daily.    . ranitidine (ZANTAC) 150 MG tablet Take 150 mg by mouth 2 (two) times daily.     No current facility-administered medications on file prior to visit.    Past Medical History  Diagnosis Date  . Diabetes mellitus without complication (HCC)   . Type 2 diabetes mellitus (HCC) 04/18/2009  . Acute cystitis 09/19/2014  . Injury of kidney 09/19/2014  . Uremia 09/19/2014  . Infection of urinary tract 09/19/2014  . Elevated erythrocyte sedimentation rate 09/22/2014  . HLD (hyperlipidemia) 04/19/2009  . Asthma   . History of renal stent   . Atrial fibrillation [facility](HCC)     Past Surgical History  Procedure Laterality Date  . Cesarean section    . Urethral stent    . Hysterotomy      Social History  reports that  she has been smoking Cigarettes.  She has been smoking about 1.00 pack per day. She does not have any smokeless tobacco history on file. She reports that she does not drink alcohol or use illicit drugs.  Family History family history is not on file. She was adopted.   Review of Systems  Constitutional: Negative.   Respiratory: Negative.   Cardiovascular: Negative.   Gastrointestinal: Negative.   Endocrine: Negative.   Musculoskeletal: Negative.   Neurological: Negative.   Hematological:  Negative.   Psychiatric/Behavioral: Negative.   All other systems reviewed and are negative.   BP 134/68 mmHg  Pulse 86  Ht 5\' 4"  (1.626 m)  Wt 228 lb (103.42 kg)  BMI 39.12 kg/m2   Physical Exam  Constitutional: She is oriented to person, place, and time. She appears well-developed and well-nourished.  HENT:  Head: Normocephalic.  Nose: Nose normal.  Mouth/Throat: Oropharynx is clear and moist.  Eyes: Conjunctivae are normal. Pupils are equal, round, and reactive to light.  Neck: Normal range of motion. Neck supple. No JVD present.  Cardiovascular: Normal rate, regular rhythm, normal heart sounds and intact distal pulses.  Exam reveals no gallop and no friction rub.   No murmur heard. Pulmonary/Chest: Effort normal and breath sounds normal. No respiratory distress. She has no wheezes. She has no rales. She exhibits no tenderness.  Abdominal: Soft. Bowel sounds are normal. She exhibits no distension. There is no tenderness.  Musculoskeletal: Normal range of motion. She exhibits no edema or tenderness.  Lymphadenopathy:    She has no cervical adenopathy.  Neurological: She is alert and oriented to person, place, and time. Coordination normal.  Skin: Skin is warm and dry. No rash noted. No erythema.  Psychiatric: She has a normal mood and affect. Her behavior is normal. Judgment and thought content normal.

## 2015-01-04 NOTE — Assessment & Plan Note (Signed)
We have encouraged her to continue to work on weaning her cigarettes and smoking cessation. She will continue to work on this and does not want any assistance with chantix.  

## 2015-01-04 NOTE — Assessment & Plan Note (Signed)
We have encouraged continued exercise, careful diet management in an effort to lose weight. 

## 2015-01-07 ENCOUNTER — Encounter
Admission: RE | Admit: 2015-01-07 | Discharge: 2015-01-07 | Disposition: A | Discharge status: Home / Self Care | Payer: Self-pay | Source: Ambulatory Visit | Attending: Urology | Admitting: Urology

## 2015-01-07 DIAGNOSIS — Z01812 Encounter for preprocedural laboratory examination: Secondary | ICD-10-CM | POA: Insufficient documentation

## 2015-01-07 LAB — DIFFERENTIAL
BASOS ABS: 0.1 10*3/uL (ref 0–0.1)
BASOS PCT: 1 %
EOS ABS: 0.8 10*3/uL — AB (ref 0–0.7)
Eosinophils Relative: 6 %
Lymphocytes Relative: 35 %
Lymphs Abs: 4.8 10*3/uL — ABNORMAL HIGH (ref 1.0–3.6)
Monocytes Absolute: 1.1 10*3/uL — ABNORMAL HIGH (ref 0.2–0.9)
Monocytes Relative: 8 %
NEUTROS ABS: 7.1 10*3/uL — AB (ref 1.4–6.5)
Neutrophils Relative %: 50 %

## 2015-01-07 LAB — CBC
HCT: 42.4 % (ref 35.0–47.0)
Hemoglobin: 13.9 g/dL (ref 12.0–16.0)
MCH: 29.2 pg (ref 26.0–34.0)
MCHC: 32.7 g/dL (ref 32.0–36.0)
MCV: 89.3 fL (ref 80.0–100.0)
PLATELETS: 344 10*3/uL (ref 150–440)
RBC: 4.75 MIL/uL (ref 3.80–5.20)
RDW: 15.2 % — AB (ref 11.5–14.5)
WBC: 14 10*3/uL — AB (ref 3.6–11.0)

## 2015-01-07 LAB — URINALYSIS COMPLETE WITH MICROSCOPIC (ARMC ONLY)
BILIRUBIN URINE: NEGATIVE
GLUCOSE, UA: NEGATIVE mg/dL
KETONES UR: NEGATIVE mg/dL
Nitrite: NEGATIVE
PH: 6 (ref 5.0–8.0)
Protein, ur: 30 mg/dL — AB
Specific Gravity, Urine: 1.011 (ref 1.005–1.030)

## 2015-01-07 LAB — BASIC METABOLIC PANEL
ANION GAP: 8 (ref 5–15)
BUN: 20 mg/dL (ref 6–20)
CALCIUM: 9.9 mg/dL (ref 8.9–10.3)
CO2: 26 mmol/L (ref 22–32)
Chloride: 105 mmol/L (ref 101–111)
Creatinine, Ser: 0.79 mg/dL (ref 0.44–1.00)
GFR calc Af Amer: >60 mL/min (ref >60)
Glucose, Bld: 167 mg/dL — ABNORMAL HIGH (ref 65–99)
POTASSIUM: 4.3 mmol/L (ref 3.5–5.1)
SODIUM: 139 mmol/L (ref 135–145)

## 2015-01-07 NOTE — Patient Instructions (Signed)
  Your procedure is scheduled on: Wednesday Jan. 18, 2017. Report to Same Day Surgery. To find out your arrival time please call [phone] between 1PM - 3PM on Tuesday Jan. 2017 .  Remember: Instructions that are not followed completely may result in serious medical risk, up to and including death, or upon the discretion of your surgeon and anesthesiologist your surgery may need to be rescheduled.    _x___ 1. Do not eat food or drink liquids after midnight. No gum chewing or hard candies.     ____ 2. No Alcohol for 24 hours before or after surgery.   ____ 3. Bring all medications with you on the day of surgery if instructed.    __x__ 4. Notify your doctor if there is any change in your medical condition     (cold, fever, infections).     Do not wear jewelry, make-up, hairpins, clips or nail polish.  Do not wear lotions, powders, or perfumes. You may wear deodorant.  Do not shave 48 hours prior to surgery. Men may shave face and neck.  Do not bring valuables to the hospital.    [facility] is not responsible for any belongings or valuables.               Contacts, dentures or bridgework may not be worn into surgery.  Leave your suitcase in the car. After surgery it may be brought to your room.  For patients admitted to the hospital, discharge time is determined by your treatment team.   Patients discharged the day of surgery will not be allowed to drive home.    Please read over the following fact sheets that you were given:   [facility] Preparing for Surgery  _x__ Take these medicines the morning of surgery with A SIP OF WATER:    1. metoprolol tartrate (LOPRESSOR)  2. ranitidine (ZANTAC  3. gemfibrozil (LOPID)     ____ Fleet Enema (as directed)   ____ Use CHG Soap as directed  _x___ Use inhalers on the day of surgery bring inhaler to hospital.  _x___ Stop metformin 2 days prior to surgery, last dose January 16, 2015.    _x___ Take 1/2 of usual insulin dose the  night before surgery and none on the morning of  surgery.   ____ Stop Coumadin/Plavix/aspirin on does not apply.  _x___ Stopped Anti-inflammatories per Dr. Andee LinemanBudzin.  Ok to take Acetaminophen or Tylenol.   ____ Stop supplements until after surgery.    ____ Bring C-Pap to the hospital.

## 2015-01-12 ENCOUNTER — Other Ambulatory Visit: Payer: Self-pay

## 2015-01-12 DIAGNOSIS — Z419 Encounter for procedure for purposes other than remedying health state, unspecified: Secondary | ICD-10-CM

## 2015-01-13 ENCOUNTER — Other Ambulatory Visit: Payer: Self-pay

## 2015-01-13 DIAGNOSIS — Z419 Encounter for procedure for purposes other than remedying health state, unspecified: Secondary | ICD-10-CM

## 2015-01-14 ENCOUNTER — Encounter: Payer: Self-pay | Admitting: Cardiovascular Disease

## 2015-01-15 LAB — CULTURE, URINE COMPREHENSIVE

## 2015-01-18 ENCOUNTER — Other Ambulatory Visit: Payer: Self-pay

## 2015-01-18 DIAGNOSIS — N390 Urinary tract infection, site not specified: Secondary | ICD-10-CM

## 2015-01-18 LAB — URINALYSIS, COMPLETE
BILIRUBIN UA: NEGATIVE
Glucose, UA: NEGATIVE
NITRITE UA: NEGATIVE
PH UA: 5.5 (ref 5.0–7.5)
SPEC GRAV UA: 1.025 (ref 1.005–1.030)
Urobilinogen, Ur: 0.2 mg/dL (ref 0.2–1.0)

## 2015-01-18 LAB — MICROSCOPIC EXAMINATION: WBC, UA: >30 /hpf — ABNORMAL HIGH (ref 0–?)

## 2015-01-19 ENCOUNTER — Encounter: Payer: Self-pay | Admitting: Registered Nurse

## 2015-01-19 ENCOUNTER — Ambulatory Visit
Admission: RE | Admit: 2015-01-19 | Discharge: 2015-01-19 | Disposition: A | Discharge status: Home / Self Care | Payer: Self-pay | Source: Ambulatory Visit | Attending: Urology | Admitting: Urology

## 2015-01-19 ENCOUNTER — Telehealth: Payer: Self-pay | Admitting: Radiology

## 2015-01-19 ENCOUNTER — Encounter
Admission: RE | Disposition: A | Discharge status: Home / Self Care | Payer: Self-pay | Source: Ambulatory Visit | Attending: Urology

## 2015-01-19 ENCOUNTER — Encounter: Payer: Self-pay | Admitting: *Deleted

## 2015-01-19 DIAGNOSIS — Z5309 Procedure and treatment not carried out because of other contraindication: Secondary | ICD-10-CM | POA: Insufficient documentation

## 2015-01-19 DIAGNOSIS — N201 Calculus of ureter: Secondary | ICD-10-CM | POA: Insufficient documentation

## 2015-01-19 LAB — GLUCOSE, CAPILLARY: GLUCOSE-CAPILLARY: 226 mg/dL — AB (ref 65–99)

## 2015-01-19 SURGERY — URETEROSCOPY, WITH LITHOTRIPSY USING HOLMIUM LASER
Anesthesia: Choice | Laterality: Left

## 2015-01-19 MED ORDER — GENTAMICIN SULFATE 40 MG/ML IJ SOLN
120.0000 mg | Freq: Once | INTRAVENOUS | Status: AC
  Administered 2015-01-19: 120 mg via INTRAVENOUS
  Filled 2015-01-19: qty 3

## 2015-01-19 MED ORDER — AMPICILLIN SODIUM 2 G IJ SOLR
2.0000 g | Freq: Once | INTRAMUSCULAR | Status: DC
  Filled 2015-01-19: qty 2000

## 2015-01-19 MED ORDER — SODIUM CHLORIDE 0.9 % IV SOLN
INTRAVENOUS | Status: DC
  Administered 2015-01-19: 07:00:00 via INTRAVENOUS

## 2015-01-19 SURGICAL SUPPLY — 27 items
BACTOSHIELD CHG 4% 4OZ (MISCELLANEOUS)
BASKET ZERO TIP 1.9FR (BASKET) IMPLANT
CATH URETL 5X70 OPEN END (CATHETERS) IMPLANT
CNTNR SPEC 2.5X3XGRAD LEK (MISCELLANEOUS)
CONRAY 43 FOR UROLOGY 50M (MISCELLANEOUS) IMPLANT
CONT SPEC 4OZ STER OR WHT (MISCELLANEOUS)
CONTAINER SPEC 2.5X3XGRAD LEK (MISCELLANEOUS) IMPLANT
FEE TECHNICIAN ONLY PER HOUR (MISCELLANEOUS) IMPLANT
GLOVE BIO SURGEON STRL SZ7 (GLOVE) IMPLANT
GLOVE BIO SURGEON STRL SZ7.5 (GLOVE) IMPLANT
GOWN STRL REUS W/ TWL LRG LVL3 (GOWN DISPOSABLE) IMPLANT
GOWN STRL REUS W/ TWL LRG LVL4 (GOWN DISPOSABLE) IMPLANT
GOWN STRL REUS W/TWL LRG LVL3 (GOWN DISPOSABLE)
GOWN STRL REUS W/TWL LRG LVL4 (GOWN DISPOSABLE)
GOWN STRL REUS W/TWL XL LVL3 (GOWN DISPOSABLE) IMPLANT
GUIDEWIRE SUPER STIFF (WIRE) IMPLANT
KIT RM TURNOVER CYSTO AR (KITS) IMPLANT
LASER FIBER 200M SMARTSCOPE (Laser) IMPLANT
LASER HOLMIUM FIBER SU 272UM (MISCELLANEOUS) IMPLANT
PACK CYSTO AR (MISCELLANEOUS) IMPLANT
SCRUB CHG 4% DYNA-HEX 4OZ (MISCELLANEOUS) IMPLANT
SENSORWIRE 0.038 NOT ANGLED (WIRE)
SET CYSTO W/LG BORE CLAMP LF (SET/KITS/TRAYS/PACK) IMPLANT
SHEATH URETERAL 13/15X36 1L (SHEATH) IMPLANT
SURGILUBE 2OZ TUBE FLIPTOP (MISCELLANEOUS) IMPLANT
SYRINGE IRR TOOMEY STRL 70CC (SYRINGE) IMPLANT
WIRE SENSOR 0.038 NOT ANGLED (WIRE) IMPLANT

## 2015-01-19 NOTE — OR Nursing (Signed)
Dr. Sherryl Barters into see patient. She has a loose cough and he states surgery should be post-poned until patient is better. IV d/c'd and patient states that is okay.

## 2015-01-19 NOTE — Telephone Encounter (Signed)
-----   Message from Hildred Laser, MD sent at 01/19/2015  7:16 AM EST ----- Patient needs to be rescheduled for surgery.  She had a bad cough this morning.  She needs to see her PCP prior to surgery.  Thanks

## 2015-01-19 NOTE — Telephone Encounter (Signed)
LMOM. Need to notify pt of new surgery date.

## 2015-01-19 NOTE — H&P (Signed)
Expand All Collapse All     11/29/2014 1:30 PM   LORIANA SAMAD 1955/12/05 [id number]  Referring provider: [facility] [address] [place], [place] [address]  No chief complaint on file.   HPI: 60 year old female with poorly controlled diabetes who presented to the emergency department for evaluation of her tachycardia from the urology clinic where she was being seen for further management of her left nonobstructing renal stone.  The patient's current episode began in mid September while she was visiting her daughter in [place]. There she presented to the emergency department and septic shock with DKA secondary to an infected left proximal ureteral stone. She underwent a left ureteral stent on 09/19/14. Her hospital course included an ICU stay for hemodynamic instability. Her creatinine rose to 4.5. The patient was found to have A. fib with RVR. She was started on amiodarone and spontaneously converted back to sinus rhythm. She also she was found to have Escherichia coli bacteremia which was resistant to penicillin but sensitive to everything else. She was discharged home on Bactrim, received a total of 14 days of antibiotics. Prior to the patient's presentation, she had been off of antibiotics for at least 2 weeks. She denies any flank pain, fevers, or significant voiding symptoms including dysuria. She has had some difficulty controlling her blood sugars recently. A CT scan done in the emergency department prior to this admission demonstrated small micro-abscesses in the left kidney with a dominant one measuring 2.7 cm. There was air within the collecting system, no parenchymal air. There was no significant hydronephrosis.  Interval history: The patient returns to the office after being seen as an inpatient for her left renal abscess. Repeat CT imaging shows that her renal abscess is resolving. She still has a 7 mm lower pole stone as well as her left ureteral stent and will  need definitive stone management. Her urine cultures at the time of her renal abscess grew klebsiella and Escherichia coli both sensitive to Bactrim. She is currently on a 10 day course of Bactrim after presenting to the hospital with left flank pain approximately 6 days ago. She denies any other symptoms this time. She has had no recent fevers, chills, nausea, or vomiting. Her biggest complaint is time is for restless legs.  PMH: Past Medical History  Diagnosis Date  . Diabetes mellitus without complication (HCC)   . Type 2 diabetes mellitus (HCC) 04/18/2009  . Acute cystitis 09/19/2014  . Injury of kidney 09/19/2014  . Uremia 09/19/2014  . Infection of urinary tract 09/19/2014  . Elevated erythrocyte sedimentation rate 09/22/2014  . HLD (hyperlipidemia) 04/19/2009  . Asthma   . History of renal stent   . Atrial fibrillation [facility])     Surgical History: Past Surgical History  Procedure Laterality Date  . Cesarean section    . Urethral stent    . Hysterotomy      Home Medications:    Medication List       This list is accurate as of: 11/29/14 1:30 PM. Always use your most recent med list.              albuterol 108 (90 BASE) MCG/ACT inhaler  Commonly known as: PROVENTIL HFA;VENTOLIN HFA  Inhale 2 puffs into the lungs every 6 (six) hours as needed for wheezing or shortness of breath.     aspirin EC 81 MG tablet  Take 81 mg by mouth daily.     atorvastatin 40 MG tablet  Commonly  known as: LIPITOR  Take 40 mg by mouth at bedtime.     celecoxib 200 MG capsule  Commonly known as: CELEBREX  Take 200 mg by mouth 2 (two) times daily.     docusate sodium 100 MG capsule  Commonly known as: COLACE  Take 1 tablet once or twice daily as needed for constipation while taking narcotic pain medicine     fluticasone 110 MCG/ACT inhaler  Commonly known as: FLOVENT HFA  Inhale 1 puff into  the lungs 2 (two) times daily.     fluticasone 50 MCG/ACT nasal spray  Commonly known as: FLONASE  Place 1 spray into both nostrils daily.     gabapentin 300 MG capsule  Commonly known as: NEURONTIN  Take 900 mg by mouth at bedtime.     gemfibrozil 600 MG tablet  Commonly known as: LOPID  Take 600 mg by mouth 2 (two) times daily.     HYDROcodone-acetaminophen 5-325 MG tablet  Commonly known as: NORCO/VICODIN  Take 1-2 tablets by mouth every 4 (four) hours as needed for moderate pain.     insulin detemir 100 UNIT/ML injection  Commonly known as: LEVEMIR  Inject 45 Units into the skin 2 (two) times daily.     metFORMIN 1000 MG tablet  Commonly known as: GLUCOPHAGE  Take 1,000 mg by mouth 2 (two) times daily.     ondansetron 4 MG tablet  Commonly known as: ZOFRAN  Take 1-2 tabs by mouth every 8 hours as needed for nausea/vomiting     ranitidine 150 MG tablet  Commonly known as: ZANTAC  Take 150 mg by mouth 2 (two) times daily.     sulfamethoxazole-trimethoprim 800-160 MG tablet  Commonly known as: BACTRIM DS,SEPTRA DS  Take 1 tablet by mouth 2 (two) times daily.     VICTOZA 18 MG/3ML Sopn  Generic drug: Liraglutide  Inject 1.2 mg into the skin daily at 12 noon.        Allergies: No Known Allergies  Family History: Family History  Problem Relation Age of Onset  . Adopted: Yes  . Other      patient adopted    Social History:  reports that she has been smoking Cigarettes. She has been smoking about 1.00 pack per day. She does not have any smokeless tobacco history on file. She reports that she does not drink alcohol. Her drug history is not on file.  ROS:                                        Physical Exam: There were no vitals taken for this visit.  Constitutional: Alert and oriented, No acute distress. HEENT: [place] AT, moist mucus membranes. Trachea midline,  no masses. Cardiovascular: No clubbing, cyanosis, or edema. Respiratory: Normal respiratory effort, no increased work of breathing. GI: Abdomen is soft, nontender, nondistended, no abdominal masses GU: No CVA tenderness.  Skin: No rashes, bruises or suspicious lesions. Lymph: No cervical or inguinal adenopathy. Neurologic: Grossly intact, no focal deficits, moving all 4 extremities. Psychiatric: Normal mood and affect.  Laboratory Data:  Recent Labs    Lab Results  Component Value Date   WBC 13.3* 11/23/2014   HGB 13.3 11/23/2014   HCT 39.4 11/23/2014   MCV 87.2 11/23/2014   PLT 346 11/23/2014       Recent Labs    Lab Results  Component Value Date   CREATININE  0.96 11/23/2014       Recent Labs    No results found for: PSA     Recent Labs    No results found for: TESTOSTERONE     Recent Labs    No results found for: HGBA1C    Urinalysis  Labs (Brief)       Component Value Date/Time   COLORURINE YELLOW* 11/23/2014 2109   APPEARANCEUR HAZY* 11/23/2014 2109   LABSPEC 1.036* 11/23/2014 2109   PHURINE 6.0 11/23/2014 2109   GLUCOSEU NEGATIVE 11/23/2014 2109   HGBUR 2+* 11/23/2014 2109   BILIRUBINUR NEGATIVE 11/23/2014 2109   KETONESUR NEGATIVE 11/23/2014 2109   PROTEINUR 100* 11/23/2014 2109   NITRITE NEGATIVE 11/23/2014 2109   LEUKOCYTESUR 3+* 11/23/2014 2109      Pertinent Imaging: IMPRESSION: Stone in the lower pole left kidney. Changes of focal pyelonephritis in the upper pole left kidney demonstrating improvement since prior study. Reactive lymph nodes. Left ureteral stent remains in place. Renal collecting system is decompressed.   Assessment & Plan:   I discussed the various surgical approaches for treating a lower pole renal stone. Her to best options this point would be ESWL or left ureteroscopy. I discussed with the patient that she has a higher chance of a  stone free rate after one procedure with left ureteroscopy. I discussed the risks, benefits, indications of ureteroscopy. She is aware the risks include, but are not limited to, bleeding, infection, and inability to be stone free after one procedure. She is agreeable to proceeding with this procedure. All questions were answered. We will schedule this in 2-3 weeks to allow full resolution of her abscess which appears to be healing well.  1. Left nonobstructing lower pole 7 mm ureteral stone -Cystoscopy, left ureteroscopy, left retropyelogram, laser lithotripsy, left ureteral stent exchange  2. Left renal abscess -Resolved     Hildred Laser, MD  [facility] [address] [place], [place] [address] [phone]

## 2015-01-20 LAB — CULTURE, URINE COMPREHENSIVE

## 2015-01-21 NOTE — Telephone Encounter (Signed)
LMOM

## 2015-01-21 NOTE — Telephone Encounter (Signed)
Pt notified of surgery scheduled for 02/16/15, to call day prior to surgery for arrival time to SDS and to hold Celebrex x 7 days prior to surgery. Advised pt per Dr Sherryl Barters she doesn't need to repeat ucx prior to surgery. Encouraged pt to see PCP for cough. Pt voices understanding.

## 2015-01-25 ENCOUNTER — Ambulatory Visit: Payer: Self-pay

## 2015-01-27 ENCOUNTER — Telehealth: Payer: Self-pay

## 2015-01-27 ENCOUNTER — Other Ambulatory Visit: Payer: Self-pay

## 2015-01-27 LAB — BASIC METABOLIC PANEL WITH GFR
BUN: 28 mg/dL — AB (ref 4–21)
Creatinine: 0.8 mg/dL (ref 0.5–1.1)
Glucose: 125 mg/dL
Sodium: 146 mmol/L (ref 137–147)

## 2015-01-27 LAB — HEMOGLOBIN A1C: Hemoglobin A1C: 7.3

## 2015-01-27 LAB — LIPID PANEL
Cholesterol: 201 mg/dL — AB (ref 0–200)
HDL: 39 mg/dL (ref 35–70)
LDL Cholesterol: 119 mg/dL
Triglycerides: 216 mg/dL — AB (ref 40–160)

## 2015-01-27 LAB — CBC AND DIFFERENTIAL
Neutrophils Absolute: 6 /uL
WBC: 12.6 10*3/mL

## 2015-01-27 LAB — HEPATIC FUNCTION PANEL
ALK PHOS: 116 U/L (ref 25–125)
ALT: 30 U/L (ref 7–35)
AST: 22 U/L (ref 13–35)
BILIRUBIN, TOTAL: 0.2 mg/dL

## 2015-01-27 LAB — TSH: TSH: 2.28 u[IU]/mL (ref 0.41–5.90)

## 2015-01-27 NOTE — Telephone Encounter (Signed)
Pt pharmacy sent a refill for flovent. Please advise.

## 2015-01-27 NOTE — Telephone Encounter (Signed)
She receives that through [facility] and they are not an EPIC.  They will have to fax the request to the [facility].

## 2015-01-27 NOTE — Telephone Encounter (Signed)
Received cardiac clearance request for pt to proceed w/ cystoscopy, left ureteroscopy, laser lithotripsy left stent exchange, left retrograde pyelogram on 02/16/15. Per Dr. Mariah Milling, pt is cleared at low risk, may hold aspirin 81 mg 5 days prior. Faxed to Amy @ [phone].

## 2015-02-02 ENCOUNTER — Other Ambulatory Visit: Payer: Self-pay | Admitting: Nurse Practitioner

## 2015-02-02 DIAGNOSIS — R0602 Shortness of breath: Secondary | ICD-10-CM

## 2015-02-08 ENCOUNTER — Ambulatory Visit
Admission: RE | Admit: 2015-02-08 | Discharge: 2015-02-08 | Disposition: A | Discharge status: Home / Self Care | Payer: Self-pay | Source: Ambulatory Visit | Attending: Nurse Practitioner | Admitting: Nurse Practitioner

## 2015-02-08 DIAGNOSIS — R0602 Shortness of breath: Secondary | ICD-10-CM | POA: Insufficient documentation

## 2015-02-16 ENCOUNTER — Encounter
Admission: RE | Disposition: A | Discharge status: Home / Self Care | Payer: Self-pay | Source: Ambulatory Visit | Attending: Urology

## 2015-02-16 ENCOUNTER — Ambulatory Visit: Payer: Self-pay | Admitting: Anesthesiology

## 2015-02-16 ENCOUNTER — Encounter: Payer: Self-pay | Admitting: *Deleted

## 2015-02-16 ENCOUNTER — Ambulatory Visit
Admission: RE | Admit: 2015-02-16 | Discharge: 2015-02-16 | Disposition: A | Discharge status: Home / Self Care | Payer: Self-pay | Source: Ambulatory Visit | Attending: Urology | Admitting: Urology

## 2015-02-16 DIAGNOSIS — J45909 Unspecified asthma, uncomplicated: Secondary | ICD-10-CM | POA: Insufficient documentation

## 2015-02-16 DIAGNOSIS — K219 Gastro-esophageal reflux disease without esophagitis: Secondary | ICD-10-CM | POA: Insufficient documentation

## 2015-02-16 DIAGNOSIS — I4891 Unspecified atrial fibrillation: Secondary | ICD-10-CM | POA: Insufficient documentation

## 2015-02-16 DIAGNOSIS — Z794 Long term (current) use of insulin: Secondary | ICD-10-CM | POA: Insufficient documentation

## 2015-02-16 DIAGNOSIS — Z79899 Other long term (current) drug therapy: Secondary | ICD-10-CM | POA: Insufficient documentation

## 2015-02-16 DIAGNOSIS — I2510 Atherosclerotic heart disease of native coronary artery without angina pectoris: Secondary | ICD-10-CM | POA: Insufficient documentation

## 2015-02-16 DIAGNOSIS — E785 Hyperlipidemia, unspecified: Secondary | ICD-10-CM | POA: Insufficient documentation

## 2015-02-16 DIAGNOSIS — F17200 Nicotine dependence, unspecified, uncomplicated: Secondary | ICD-10-CM | POA: Insufficient documentation

## 2015-02-16 DIAGNOSIS — N200 Calculus of kidney: Secondary | ICD-10-CM | POA: Insufficient documentation

## 2015-02-16 DIAGNOSIS — E1165 Type 2 diabetes mellitus with hyperglycemia: Secondary | ICD-10-CM | POA: Insufficient documentation

## 2015-02-16 DIAGNOSIS — Z6838 Body mass index (BMI) 38.0-38.9, adult: Secondary | ICD-10-CM | POA: Insufficient documentation

## 2015-02-16 DIAGNOSIS — I493 Ventricular premature depolarization: Secondary | ICD-10-CM | POA: Insufficient documentation

## 2015-02-16 DIAGNOSIS — Z7982 Long term (current) use of aspirin: Secondary | ICD-10-CM | POA: Insufficient documentation

## 2015-02-16 HISTORY — PX: URETEROSCOPY WITH HOLMIUM LASER LITHOTRIPSY: SHX6645

## 2015-02-16 HISTORY — PX: CYSTOSCOPY W/ RETROGRADES: SHX1426

## 2015-02-16 HISTORY — PX: CYSTOSCOPY W/ URETERAL STENT PLACEMENT: SHX1429

## 2015-02-16 LAB — GLUCOSE, CAPILLARY
GLUCOSE-CAPILLARY: 124 mg/dL — AB (ref 65–99)
Glucose-Capillary: 117 mg/dL — ABNORMAL HIGH (ref 65–99)

## 2015-02-16 SURGERY — URETEROSCOPY, WITH LITHOTRIPSY USING HOLMIUM LASER
Anesthesia: General | Laterality: Left | Wound class: Clean Contaminated

## 2015-02-16 MED ORDER — LIDOCAINE HCL (PF) 4 % IJ SOLN
INTRAMUSCULAR | Status: DC | PRN
  Administered 2015-02-16: 4 mL via RESPIRATORY_TRACT

## 2015-02-16 MED ORDER — MIDAZOLAM HCL 2 MG/2ML IJ SOLN
INTRAMUSCULAR | Status: DC | PRN
  Administered 2015-02-16: 1 mg via INTRAVENOUS

## 2015-02-16 MED ORDER — ROCURONIUM BROMIDE 100 MG/10ML IV SOLN
INTRAVENOUS | Status: DC | PRN
  Administered 2015-02-16: 40 mg via INTRAVENOUS

## 2015-02-16 MED ORDER — SODIUM CHLORIDE 0.9 % IV SOLN
INTRAVENOUS | Status: DC
  Administered 2015-02-16: 08:00:00 via INTRAVENOUS

## 2015-02-16 MED ORDER — GENTAMICIN SULFATE 40 MG/ML IJ SOLN
120.0000 mg | Freq: Once | INTRAVENOUS | Status: AC
  Administered 2015-02-16: 120 mg via INTRAVENOUS
  Filled 2015-02-16: qty 3

## 2015-02-16 MED ORDER — ONDANSETRON HCL 4 MG PO TABS: 4.0000 mg | ORAL_TABLET | Freq: Three times a day (TID) | ORAL | Status: DC | PRN

## 2015-02-16 MED ORDER — OXYCODONE-ACETAMINOPHEN 5-325 MG PO TABS: 1.0000 | ORAL_TABLET | ORAL | Status: DC | PRN

## 2015-02-16 MED ORDER — PROMETHAZINE HCL 25 MG/ML IJ SOLN: 6.2500 mg | INTRAMUSCULAR | Status: DC | PRN

## 2015-02-16 MED ORDER — FENTANYL CITRATE (PF) 100 MCG/2ML IJ SOLN: 25.0000 ug | INTRAMUSCULAR | Status: DC | PRN

## 2015-02-16 MED ORDER — ONDANSETRON HCL 4 MG/2ML IJ SOLN
INTRAMUSCULAR | Status: DC | PRN
  Administered 2015-02-16: 4 mg via INTRAVENOUS

## 2015-02-16 MED ORDER — PROPOFOL 10 MG/ML IV BOLUS
INTRAVENOUS | Status: DC | PRN
  Administered 2015-02-16: 180 mg via INTRAVENOUS

## 2015-02-16 MED ORDER — SULFAMETHOXAZOLE-TRIMETHOPRIM 400-80 MG PO TABS: 1.0000 | ORAL_TABLET | Freq: Two times a day (BID) | ORAL | Status: DC

## 2015-02-16 MED ORDER — IOTHALAMATE MEGLUMINE 43 % IV SOLN
INTRAVENOUS | Status: DC | PRN
  Administered 2015-02-16: 15 mL

## 2015-02-16 MED ORDER — ALBUTEROL SULFATE HFA 108 (90 BASE) MCG/ACT IN AERS
INHALATION_SPRAY | RESPIRATORY_TRACT | Status: DC | PRN
  Administered 2015-02-16 (×2): 4 via RESPIRATORY_TRACT

## 2015-02-16 MED ORDER — LIDOCAINE HCL (CARDIAC) 20 MG/ML IV SOLN
INTRAVENOUS | Status: DC | PRN
  Administered 2015-02-16: 40 mg via INTRAVENOUS

## 2015-02-16 MED ORDER — FENTANYL CITRATE (PF) 100 MCG/2ML IJ SOLN
INTRAMUSCULAR | Status: DC | PRN
  Administered 2015-02-16: 100 ug via INTRAVENOUS

## 2015-02-16 MED ORDER — NEOSTIGMINE METHYLSULFATE 10 MG/10ML IV SOLN
INTRAVENOUS | Status: DC | PRN
  Administered 2015-02-16: 3 mg via INTRAVENOUS

## 2015-02-16 MED ORDER — GLYCOPYRROLATE 0.2 MG/ML IJ SOLN
INTRAMUSCULAR | Status: DC | PRN
  Administered 2015-02-16: 0.6 mg via INTRAVENOUS

## 2015-02-16 MED ORDER — SODIUM CHLORIDE 0.9 % IV SOLN
2.0000 g | Freq: Once | INTRAVENOUS | Status: AC
  Administered 2015-02-16: 2 g via INTRAVENOUS
  Filled 2015-02-16: qty 2000

## 2015-02-16 MED ORDER — PHENYLEPHRINE HCL 10 MG/ML IJ SOLN
INTRAMUSCULAR | Status: DC | PRN
  Administered 2015-02-16: 100 ug via INTRAVENOUS

## 2015-02-16 SURGICAL SUPPLY — 35 items
BACTOSHIELD CHG 4% 4OZ (MISCELLANEOUS) ×2
BASKET ZERO TIP 1.9FR (BASKET) ×3 IMPLANT
CATH URETL 5X70 OPEN END (CATHETERS) ×3 IMPLANT
CNTNR SPEC 2.5X3XGRAD LEK (MISCELLANEOUS) ×1
CONRAY 43 FOR UROLOGY 50M (MISCELLANEOUS) ×3 IMPLANT
CONT SPEC 4OZ STER OR WHT (MISCELLANEOUS) ×2
CONTAINER SPEC 2.5X3XGRAD LEK (MISCELLANEOUS) ×1 IMPLANT
CONTOUR SOFT URETERAL STENT ×3 IMPLANT
FEE TECHNICIAN ONLY PER HOUR (MISCELLANEOUS) IMPLANT
GLOVE BIO SURGEON STRL SZ7 (GLOVE) ×6 IMPLANT
GLOVE BIO SURGEON STRL SZ7.5 (GLOVE) ×3 IMPLANT
GOWN STRL REUS W/ TWL LRG LVL3 (GOWN DISPOSABLE) ×1 IMPLANT
GOWN STRL REUS W/ TWL LRG LVL4 (GOWN DISPOSABLE) ×1 IMPLANT
GOWN STRL REUS W/TWL LRG LVL3 (GOWN DISPOSABLE) ×2
GOWN STRL REUS W/TWL LRG LVL4 (GOWN DISPOSABLE) ×2
GOWN STRL REUS W/TWL XL LVL3 (GOWN DISPOSABLE) ×3 IMPLANT
GUIDEWIRE SUPER STIFF (WIRE) IMPLANT
INTRODUCER DILATOR DOUBLE (INTRODUCER) ×3 IMPLANT
KIT RM TURNOVER CYSTO AR (KITS) ×3 IMPLANT
LASER FIBER 200M SMARTSCOPE (Laser) ×3 IMPLANT
LASER HOLMIUM FIBER SU 272UM (MISCELLANEOUS) IMPLANT
PACK CYSTO AR (MISCELLANEOUS) ×3 IMPLANT
SCRUB CHG 4% DYNA-HEX 4OZ (MISCELLANEOUS) ×1 IMPLANT
SENSORWIRE 0.038 NOT ANGLED (WIRE) ×3
SET CYSTO W/LG BORE CLAMP LF (SET/KITS/TRAYS/PACK) ×3 IMPLANT
SHEATH URETERAL 12FRX35CM (MISCELLANEOUS) ×3 IMPLANT
SHEATH URETERAL 13/15X36 1L (SHEATH) IMPLANT
SOL .9 NS 3000ML IRR  AL (IV SOLUTION) ×2
SOL .9 NS 3000ML IRR UROMATIC (IV SOLUTION) ×1 IMPLANT
STENT URET 6FRX24 CONTOUR (STENTS) ×3 IMPLANT
STENT URET 6FRX26 CONTOUR (STENTS) IMPLANT
SURGILUBE 2OZ TUBE FLIPTOP (MISCELLANEOUS) ×3 IMPLANT
SYRINGE IRR TOOMEY STRL 70CC (SYRINGE) ×3 IMPLANT
WATER STERILE IRR 1000ML POUR (IV SOLUTION) ×3 IMPLANT
WIRE SENSOR 0.038 NOT ANGLED (WIRE) ×1 IMPLANT

## 2015-02-16 NOTE — Transfer of Care (Signed)
Immediate Anesthesia Transfer of Care Note  Patient: Rose Luna  Procedure(s) Performed: Procedure(s): ATTEMPTED URETEROSCOPY  (Left) CYSTOSCOPY WITH STENT REPLACEMENT (Left) CYSTOSCOPY WITH RETROGRADE PYELOGRAM (Left)  Patient Location: PACU  Anesthesia Type:General  Level of Consciousness: awake and patient cooperative  Airway & Oxygen Therapy: Patient Spontanous Breathing and Patient connected to nasal cannula oxygen  Post-op Assessment: Report given to RN and Post -op Vital signs reviewed and stable  Post vital signs: Reviewed and stable  Last Vitals:  Filed Vitals:   02/16/15 0730 02/16/15 0919  BP: 124/49 114/72  Pulse: 62 70  Temp: 36.8 C 37.2 C  Resp: 20 21    Complications: No apparent anesthesia complications

## 2015-02-16 NOTE — Interval H&P Note (Signed)
History and Physical Interval Note:  02/16/2015 7:30 AM  Rose Luna  has presented today for surgery, with the diagnosis of LEFT RENAL STONE  The various methods of treatment have been discussed with the patient and family. After consideration of risks, benefits and other options for treatment, the patient has consented to  Procedure(s): URETEROSCOPY WITH HOLMIUM LASER LITHOTRIPSY (Left) CYSTOSCOPY WITH STENT REPLACEMENT (Left) CYSTOSCOPY WITH RETROGRADE PYELOGRAM (Left) as a surgical intervention .  The patient's history has been reviewed, patient examined, no change in status, stable for surgery.  I have reviewed the patient's chart and labs.  Questions were answered to the patient's satisfaction.    RRR Unlabored respirations   Cloyde Reams [place]

## 2015-02-16 NOTE — Anesthesia Procedure Notes (Signed)
Procedure Name: Intubation Date/Time: 02/16/2015 8:30 AM Performed by: Shirlee Limerick, Joffrey Kerce Pre-anesthesia Checklist: Patient identified, Emergency Drugs available, Suction available and Patient being monitored Patient Re-evaluated:Patient Re-evaluated prior to inductionOxygen Delivery Method: Circle system utilized Preoxygenation: Pre-oxygenation with 100% oxygen Intubation Type: IV induction Laryngoscope Size: Mac and 3 Grade View: Grade II Tube size: 7.0 mm Number of attempts: 1 Secured at: 21 cm Tube secured with: Tape Dental Injury: Teeth and Oropharynx as per pre-operative assessment

## 2015-02-16 NOTE — Anesthesia Postprocedure Evaluation (Signed)
Anesthesia Post Note  Patient: Rose Luna  Procedure(s) Performed: Procedure(s) (LRB): ATTEMPTED URETEROSCOPY  (Left) CYSTOSCOPY WITH STENT REPLACEMENT (Left) CYSTOSCOPY WITH RETROGRADE PYELOGRAM (Left)  Patient location during evaluation: PACU Anesthesia Type: General Level of consciousness: awake and alert Pain management: pain level controlled Vital Signs Assessment: post-procedure vital signs reviewed and stable Respiratory status: spontaneous breathing, nonlabored ventilation, respiratory function stable and patient connected to nasal cannula oxygen Cardiovascular status: blood pressure returned to baseline and stable Postop Assessment: no signs of nausea or vomiting Anesthetic complications: no    Last Vitals:  Filed Vitals:   02/16/15 1032 02/16/15 1054  BP: 110/61 113/61  Pulse: 87 60  Temp: 36 C 36.1 C  Resp: 16 16    Last Pain:  Filed Vitals:   02/16/15 1055  PainSc: 0-No pain                 Lenard Simmer

## 2015-02-16 NOTE — Discharge Instructions (Signed)
AMBULATORY SURGERY  DISCHARGE INSTRUCTIONS   1) The drugs that you were given will stay in your system until tomorrow so for the next 24 hours you should not:  A) Drive an automobile B) Make any legal decisions C) Drink any alcoholic beverage   2) You may resume regular meals tomorrow.  Today it is better to start with liquids and gradually work up to solid foods.  You may eat anything you prefer, but it is better to start with liquids, then soup and crackers, and gradually work up to solid foods.   3) Please notify your doctor immediately if you have any unusual bleeding, trouble breathing, redness and pain at the surgery site, drainage, fever, or pain not relieved by medication. 4)   5) Your post-operative visit with Dr.                                     is: Date:                        Time:    Please call to schedule your post-operative visit.  6) Additional Instructions:      Ureteral Stent Implantation, Care After Refer to this sheet in the next few weeks. These instructions provide you with information on caring for yourself after your procedure. Your health care provider may also give you more specific instructions. Your treatment has been planned according to current medical practices, but problems sometimes occur. Call your health care provider if you have any problems or questions after your procedure. WHAT TO EXPECT AFTER THE PROCEDURE You should be back to normal activity within 48 hours after the procedure. Nausea and vomiting may occur and are commonly the result of anesthesia. It is common to experience sharp pain in the back or lower abdomen and penis with voiding. This is caused by movement of the ends of the stent with the act of urinating.It usually goes away within minutes after you have stopped urinating. HOME CARE INSTRUCTIONS Make sure to drink plenty of fluids. You may have small amounts of bleeding, causing your urine to be red. This is normal.  Certain movements may trigger pain or a feeling that you need to urinate. You may be given medicines to prevent infection or bladder spasms. Be sure to take all medicines as directed. Only take over-the-counter or prescription medicines for pain, discomfort, or fever as directed by your health care provider. Do not take aspirin, as this can make bleeding worse. Your stent will be left in until the blockage is resolved. This may take 2 weeks or longer, depending on the reason for stent implantation. You may have an X-ray exam to make sure your ureter is open and that the stent has not moved out of position (migrated). The stent can be removed by your health care provider in the office. Medicines may be given for comfort while the stent is being removed. Be sure to keep all follow-up appointments so your health care provider can check that you are healing properly. SEEK MEDICAL CARE IF: You experience increasing pain. Your pain medicine is not working. SEEK IMMEDIATE MEDICAL CARE IF: Your urine is dark red or has blood clots. You are leaking urine (incontinent). You have a fever, chills, feeling sick to your stomach (nausea), or vomiting. Your pain is not relieved by pain medicine. The end of the stent comes out of  the urethra. You are unable to urinate.   This information is not intended to replace advice given to you by your health care provider. Make sure you discuss any questions you have with your health care provider.   Document Released: 08/20/2012 Document Revised: 12/23/2012 Document Reviewed: 07/02/2014 Elsevier Interactive Patient Education [employer]

## 2015-02-16 NOTE — Anesthesia Preprocedure Evaluation (Signed)
Anesthesia Evaluation  Patient identified by MRN, date of birth, ID band Patient awake    Reviewed: Allergy & Precautions, H&P , NPO status , Patient's Chart, lab work & pertinent test results, reviewed documented beta blocker date and time   History of Anesthesia Complications Negative for: history of anesthetic complications  Airway Mallampati: III  TM Distance: >3 FB Neck ROM: full    Dental no notable dental hx. (+) Missing, Poor Dentition, Chipped   Pulmonary neg pulmonary ROS, neg shortness of breath, asthma , neg sleep apnea, neg COPD, Recent URI , Resolved, Current Smoker,    Pulmonary exam normal breath sounds clear to auscultation       Cardiovascular Exercise Tolerance: Good (-) hypertension(-) angina+ CAD and + Peripheral Vascular Disease  (-) Past MI, (-) Cardiac Stents and (-) CABG Normal cardiovascular exam+ dysrhythmias Atrial Fibrillation (-) Valvular Problems/Murmurs Rhythm:regular Rate:Normal     Neuro/Psych negative neurological ROS  negative psych ROS   GI/Hepatic Neg liver ROS, GERD  ,  Endo/Other  diabetesMorbid obesity  Renal/GU Renal disease (kidney stones)  negative genitourinary   Musculoskeletal   Abdominal   Peds  Hematology negative hematology ROS (+)   Anesthesia Other Findings Past Medical History:   Diabetes mellitus without complication (HCC)                 Type 2 diabetes mellitus (HCC)                  04/18/2009    Acute cystitis                                  09/19/2014    Injury of kidney                                09/19/2014    Uremia                                          09/19/2014    Infection of urinary tract                      09/19/2014    Elevated erythrocyte sedimentation rate         09/22/2014    HLD (hyperlipidemia)                            04/19/2009    Asthma                                                       History of renal stent                                        Atrial fibrillation (HCC)                                    Reproductive/Obstetrics negative OB ROS  Anesthesia Physical Anesthesia Plan  ASA: III  Anesthesia Plan: General   Post-op Pain Management:    Induction:   Airway Management Planned:   Additional Equipment:   Intra-op Plan:   Post-operative Plan:   Informed Consent: I have reviewed the patients History and Physical, chart, labs and discussed the procedure including the risks, benefits and alternatives for the proposed anesthesia with the patient or authorized representative who has indicated his/her understanding and acceptance.   Dental Advisory Given  Plan Discussed with: Anesthesiologist, CRNA and Surgeon  Anesthesia Plan Comments:         Anesthesia Quick Evaluation

## 2015-02-16 NOTE — H&P (View-Only) (Signed)
Expand All Collapse All     11/29/2014 1:30 PM   Rose Luna 1955/12/05 [id number]  Referring provider: [facility] [address] [place], [place] [address]  No chief complaint on file.   HPI: 60 year old female with poorly controlled diabetes who presented to the emergency department for evaluation of her tachycardia from the urology clinic where she was being seen for further management of her left nonobstructing renal stone.  The patient's current episode began in mid September while she was visiting her daughter in [place]. There she presented to the emergency department and septic shock with DKA secondary to an infected left proximal ureteral stone. She underwent a left ureteral stent on 09/19/14. Her hospital course included an ICU stay for hemodynamic instability. Her creatinine rose to 4.5. The patient was found to have A. fib with RVR. She was started on amiodarone and spontaneously converted back to sinus rhythm. She also she was found to have Escherichia coli bacteremia which was resistant to penicillin but sensitive to everything else. She was discharged home on Bactrim, received a total of 14 days of antibiotics. Prior to the patient's presentation, she had been off of antibiotics for at least 2 weeks. She denies any flank pain, fevers, or significant voiding symptoms including dysuria. She has had some difficulty controlling her blood sugars recently. A CT scan done in the emergency department prior to this admission demonstrated small micro-abscesses in the left kidney with a dominant one measuring 2.7 cm. There was air within the collecting system, no parenchymal air. There was no significant hydronephrosis.  Interval history: The patient returns to the office after being seen as an inpatient for her left renal abscess. Repeat CT imaging shows that her renal abscess is resolving. She still has a 7 mm lower pole stone as well as her left ureteral stent and will  need definitive stone management. Her urine cultures at the time of her renal abscess grew klebsiella and Escherichia coli both sensitive to Bactrim. She is currently on a 10 day course of Bactrim after presenting to the hospital with left flank pain approximately 6 days ago. She denies any other symptoms this time. She has had no recent fevers, chills, nausea, or vomiting. Her biggest complaint is time is for restless legs.  PMH: Past Medical History  Diagnosis Date  . Diabetes mellitus without complication (HCC)   . Type 2 diabetes mellitus (HCC) 04/18/2009  . Acute cystitis 09/19/2014  . Injury of kidney 09/19/2014  . Uremia 09/19/2014  . Infection of urinary tract 09/19/2014  . Elevated erythrocyte sedimentation rate 09/22/2014  . HLD (hyperlipidemia) 04/19/2009  . Asthma   . History of renal stent   . Atrial fibrillation [facility])     Surgical History: Past Surgical History  Procedure Laterality Date  . Cesarean section    . Urethral stent    . Hysterotomy      Home Medications:    Medication List       This list is accurate as of: 11/29/14 1:30 PM. Always use your most recent med list.              albuterol 108 (90 BASE) MCG/ACT inhaler  Commonly known as: PROVENTIL HFA;VENTOLIN HFA  Inhale 2 puffs into the lungs every 6 (six) hours as needed for wheezing or shortness of breath.     aspirin EC 81 MG tablet  Take 81 mg by mouth daily.     atorvastatin 40 MG tablet  Commonly  known as: LIPITOR  Take 40 mg by mouth at bedtime.     celecoxib 200 MG capsule  Commonly known as: CELEBREX  Take 200 mg by mouth 2 (two) times daily.     docusate sodium 100 MG capsule  Commonly known as: COLACE  Take 1 tablet once or twice daily as needed for constipation while taking narcotic pain medicine     fluticasone 110 MCG/ACT inhaler  Commonly known as: FLOVENT HFA  Inhale 1 puff into  the lungs 2 (two) times daily.     fluticasone 50 MCG/ACT nasal spray  Commonly known as: FLONASE  Place 1 spray into both nostrils daily.     gabapentin 300 MG capsule  Commonly known as: NEURONTIN  Take 900 mg by mouth at bedtime.     gemfibrozil 600 MG tablet  Commonly known as: LOPID  Take 600 mg by mouth 2 (two) times daily.     HYDROcodone-acetaminophen 5-325 MG tablet  Commonly known as: NORCO/VICODIN  Take 1-2 tablets by mouth every 4 (four) hours as needed for moderate pain.     insulin detemir 100 UNIT/ML injection  Commonly known as: LEVEMIR  Inject 45 Units into the skin 2 (two) times daily.     metFORMIN 1000 MG tablet  Commonly known as: GLUCOPHAGE  Take 1,000 mg by mouth 2 (two) times daily.     ondansetron 4 MG tablet  Commonly known as: ZOFRAN  Take 1-2 tabs by mouth every 8 hours as needed for nausea/vomiting     ranitidine 150 MG tablet  Commonly known as: ZANTAC  Take 150 mg by mouth 2 (two) times daily.     sulfamethoxazole-trimethoprim 800-160 MG tablet  Commonly known as: BACTRIM DS,SEPTRA DS  Take 1 tablet by mouth 2 (two) times daily.     VICTOZA 18 MG/3ML Sopn  Generic drug: Liraglutide  Inject 1.2 mg into the skin daily at 12 noon.        Allergies: No Known Allergies  Family History: Family History  Problem Relation Age of Onset  . Adopted: Yes  . Other      patient adopted    Social History:  reports that she has been smoking Cigarettes. She has been smoking about 1.00 pack per day. She does not have any smokeless tobacco history on file. She reports that she does not drink alcohol. Her drug history is not on file.  ROS:                                        Physical Exam: There were no vitals taken for this visit.  Constitutional: Alert and oriented, No acute distress. HEENT: [place] AT, moist mucus membranes. Trachea midline,  no masses. Cardiovascular: No clubbing, cyanosis, or edema. Respiratory: Normal respiratory effort, no increased work of breathing. GI: Abdomen is soft, nontender, nondistended, no abdominal masses GU: No CVA tenderness.  Skin: No rashes, bruises or suspicious lesions. Lymph: No cervical or inguinal adenopathy. Neurologic: Grossly intact, no focal deficits, moving all 4 extremities. Psychiatric: Normal mood and affect.  Laboratory Data:  Recent Labs    Lab Results  Component Value Date   WBC 13.3* 11/23/2014   HGB 13.3 11/23/2014   HCT 39.4 11/23/2014   MCV 87.2 11/23/2014   PLT 346 11/23/2014       Recent Labs    Lab Results  Component Value Date   CREATININE   0.96 11/23/2014       Recent Labs    No results found for: PSA     Recent Labs    No results found for: TESTOSTERONE     Recent Labs    No results found for: HGBA1C    Urinalysis  Labs (Brief)       Component Value Date/Time   COLORURINE YELLOW* 11/23/2014 2109   APPEARANCEUR HAZY* 11/23/2014 2109   LABSPEC 1.036* 11/23/2014 2109   PHURINE 6.0 11/23/2014 2109   GLUCOSEU NEGATIVE 11/23/2014 2109   HGBUR 2+* 11/23/2014 2109   BILIRUBINUR NEGATIVE 11/23/2014 2109   KETONESUR NEGATIVE 11/23/2014 2109   PROTEINUR 100* 11/23/2014 2109   NITRITE NEGATIVE 11/23/2014 2109   LEUKOCYTESUR 3+* 11/23/2014 2109      Pertinent Imaging: IMPRESSION: Stone in the lower pole left kidney. Changes of focal pyelonephritis in the upper pole left kidney demonstrating improvement since prior study. Reactive lymph nodes. Left ureteral stent remains in place. Renal collecting system is decompressed.   Assessment & Plan:   I discussed the various surgical approaches for treating a lower pole renal stone. Her to best options this point would be ESWL or left ureteroscopy. I discussed with the patient that she has a higher chance of a  stone free rate after one procedure with left ureteroscopy. I discussed the risks, benefits, indications of ureteroscopy. She is aware the risks include, but are not limited to, bleeding, infection, and inability to be stone free after one procedure. She is agreeable to proceeding with this procedure. All questions were answered. We will schedule this in 2-3 weeks to allow full resolution of her abscess which appears to be healing well.  1. Left nonobstructing lower pole 7 mm ureteral stone -Cystoscopy, left ureteroscopy, left retropyelogram, laser lithotripsy, left ureteral stent exchange  2. Left renal abscess -Resolved     Mekayla Soman Hooley Martice Doty, MD  [facility] [address], [address] [place], [place] [address] [phone]       

## 2015-02-16 NOTE — Op Note (Signed)
Date of procedure: 02/16/2015  Preoperative diagnosis:  1. Left renal stone  Postoperative diagnosis:  1. Left renal stone   Procedure: 1. Cystoscopy 2. Left retrograde pyelogram interpretation 3. Attempted left ureteroscopy 4. Left ureteral stent exchange 6 [place] by 24 cm   Surgeon: Baruch Gouty, MD  Anesthesia: General  Complications: None  Intraoperative findings: I was unable to navigate into the lower pole of the left kidney due to equipment failure with the flexible ureteroscope as was unable to be completely retroflexed. The backup scope at the same issue. This was reported to the OR administration.   EBL: None  Specimem: None  Drains: 6 [place] by [place] meter left double-J ureteral stent  Disposition: Stable to the postanesthesia care unit  Indication for procedure: The patient is a 60 y.o. female with the left renal stone in the lower pole as well as a history of abscess to have a left ureteral stent previously placed presents today for definitive stone management.  After reviewing the management options for treatment, the patient elected to proceed with the above surgical procedure(s). We have discussed the potential benefits and risks of the procedure, side effects of the proposed treatment, the likelihood of the patient achieving the goals of the procedure, and any potential problems that might occur during the procedure or recuperation. Informed consent has been obtained.  Description of procedure: The patient was met in the preoperative area. All risks, benefits, and indications of the procedure were described in great detail. The patient consented to the procedure. Preoperative antibiotics were given. The patient was taken to the operative theater. General anesthesia was induced per the anesthesia service. The patient was then placed in the dorsal lithotomy position and prepped and draped in the usual sterile fashion. A preoperative timeout was called.    A 21  French 30 cystoscope was inserted in the patient's bladder per urethra atraumatically. The left renal stent was noted to be calcified however was able to remove the flexible graspers. A sensor wire was exchanged through the stent and the stent removed. A sensor wire was in the renal pelvis on fluoroscopy. Using a dual-lumen catheter, second sensor wires placed next to the first sensor wire up to the level of the renal pelvis on fluoroscopy. The dual-lumen catheter was removed. A ureteral access sheath was then placed over the sensor wire with little resistance under fluoroscopy. The inner sheath and the sensor wire removed. A flexible ureteroscope was then placed up to the level of the renal pelvis. Pan nephroscopy was time was impossible due to this flexor ureteroscope not be able to completely retroflexed and lower pole over the stone was located. A second ureteroscope was then exchanged for the first flexor ureteroscope. It was also broken unable to be retroflexed into the lower pole of the kidney. There were no more flexible ureteroscopes available in the hospital, so we had no choice but to abort the procedure as we could not navigate into the lower pole to address her lower pole stone. Retrograde pyelogram was obtained to identify the renal pelvis. The ureteral access sheath removed under direct visualization with no obvious trauma. The cystoscope was reassembled over the remaining sensor wire and a 6 [place] by 24 cm double-J ureter stent was placed. The sensor wire was removed. The stent was confirmed in the correct position with a curl seen in the patient's renal pelvis on fluoroscopy and a curl seen in the patient's urinary bladder direct position. The patient's bladder was drained she  was woke from anesthesia without per Dictations.   Plan: The patient will follow-up in the near future once we have a functioning flexor ureteroscope to complete her procedure.  Baruch Gouty, M.D.

## 2015-02-18 ENCOUNTER — Encounter: Payer: Self-pay | Admitting: Urology

## 2015-02-21 ENCOUNTER — Telehealth: Payer: Self-pay | Admitting: Radiology

## 2015-02-21 NOTE — Telephone Encounter (Signed)
Notified pt of surgery scheduled 03/09/15, pre-admit testing appt on 2/24 :45 and to call day prior to surgery for arrival time to SDS. Pt voices understanding.

## 2015-02-22 ENCOUNTER — Ambulatory Visit: Payer: Self-pay

## 2015-02-22 ENCOUNTER — Telehealth: Payer: Self-pay

## 2015-02-24 ENCOUNTER — Ambulatory Visit: Payer: Self-pay | Admitting: Ophthalmology

## 2015-02-24 LAB — HM DIABETES EYE EXAM

## 2015-02-25 ENCOUNTER — Encounter
Admission: RE | Admit: 2015-02-25 | Discharge: 2015-02-25 | Disposition: A | Discharge status: Home / Self Care | Payer: Self-pay | Source: Ambulatory Visit | Attending: Urology | Admitting: Urology

## 2015-02-25 DIAGNOSIS — Z01812 Encounter for preprocedural laboratory examination: Secondary | ICD-10-CM | POA: Insufficient documentation

## 2015-02-25 LAB — CBC
HCT: 42.3 % (ref 35.0–47.0)
HEMOGLOBIN: 13.8 g/dL (ref 12.0–16.0)
MCH: 28.8 pg (ref 26.0–34.0)
MCHC: 32.7 g/dL (ref 32.0–36.0)
MCV: 88 fL (ref 80.0–100.0)
Platelets: 285 10*3/uL (ref 150–440)
RBC: 4.8 MIL/uL (ref 3.80–5.20)
RDW: 14.7 % — ABNORMAL HIGH (ref 11.5–14.5)
WBC: 13.6 10*3/uL — AB (ref 3.6–11.0)

## 2015-02-25 LAB — BASIC METABOLIC PANEL
ANION GAP: 11 (ref 5–15)
BUN: 26 mg/dL — ABNORMAL HIGH (ref 6–20)
CHLORIDE: 101 mmol/L (ref 101–111)
CO2: 24 mmol/L (ref 22–32)
Calcium: 9.7 mg/dL (ref 8.9–10.3)
Creatinine, Ser: 0.96 mg/dL (ref 0.44–1.00)
GFR calc non Af Amer: >60 mL/min (ref >60)
Glucose, Bld: 204 mg/dL — ABNORMAL HIGH (ref 65–99)
POTASSIUM: 4.1 mmol/L (ref 3.5–5.1)
Sodium: 136 mmol/L (ref 135–145)

## 2015-02-25 NOTE — Patient Instructions (Signed)
  Your procedure is scheduled on: Wednesday 03/09/15 Report to Day Surgery. 2ND FLOOR MEDICAL MALL ENTRANCE To find out your arrival time please call [phone] between 1PM - 3PM on Tuesday 03/08/15.  Remember: Instructions that are not followed completely may result in serious medical risk, up to and including death, or upon the discretion of your surgeon and anesthesiologist your surgery may need to be rescheduled.    __X__ 1. Do not eat food or drink liquids after midnight. No gum chewing or hard candies.     __X__ 2. No Alcohol for 24 hours before or after surgery.   ____ 3. Bring all medications with you on the day of surgery if instructed.    __X__ 4. Notify your doctor if there is any change in your medical condition     (cold, fever, infections).     Do not wear jewelry, make-up, hairpins, clips or nail polish.  Do not wear lotions, powders, or perfumes. You may wear deodorant.  Do not shave 48 hours prior to surgery. Men may shave face and neck.  Do not bring valuables to the hospital.    [facility] is not responsible for any belongings or valuables.               Contacts, dentures or bridgework may not be worn into surgery.  Leave your suitcase in the car. After surgery it may be brought to your room.  For patients admitted to the hospital, discharge time is determined by your                treatment team.   Patients discharged the day of surgery will not be allowed to drive home.   Please read over the following fact sheets that you were given:   Surgical Site Infection Prevention   __X__ Take these medicines the morning of surgery with A SIP OF WATER:    1. FENOFIBRATE  2. GABAPENTIN  3. METOPROLOL  4. RANITIDINE  5.  6.  ____ Fleet Enema (as directed)   ____ Use CHG Soap as directed  __X__ Use inhalers on the day of surgery  __X__ Stop metformin 2 days prior to surgery    __X__ Take 1/2 of usual insulin dose the night before surgery and none on the  morning of surgery.   __X__ Stop Coumadin/Plavix/aspirin on 5 DAYS BEFORE SURGERY AS INSTRUCTED BY DR Mariah Milling  ____ Stop Anti-inflammatories on    ____ Stop supplements until after surgery.    ____ Bring C-Pap to the hospital.

## 2015-02-26 LAB — URINE CULTURE

## 2015-02-28 ENCOUNTER — Other Ambulatory Visit: Payer: Self-pay

## 2015-02-28 DIAGNOSIS — N200 Calculus of kidney: Secondary | ICD-10-CM

## 2015-03-01 ENCOUNTER — Ambulatory Visit (INDEPENDENT_AMBULATORY_CARE_PROVIDER_SITE_OTHER): Payer: Self-pay | Admitting: Urology

## 2015-03-01 ENCOUNTER — Encounter: Payer: Self-pay | Admitting: Urology

## 2015-03-01 VITALS — BP 128/78 | HR 73 | Ht 64.0 in | Wt 238.6 lb

## 2015-03-01 DIAGNOSIS — Z960 Presence of urogenital implants: Secondary | ICD-10-CM

## 2015-03-01 DIAGNOSIS — L02211 Cutaneous abscess of abdominal wall: Secondary | ICD-10-CM

## 2015-03-01 MED ORDER — SULFAMETHOXAZOLE-TRIMETHOPRIM 800-160 MG PO TABS: 1.0000 | ORAL_TABLET | Freq: Two times a day (BID) | ORAL | Status: DC

## 2015-03-01 NOTE — Pre-Procedure Instructions (Signed)
Per Amy at [facility] urological patient will have repeat UA done in the office

## 2015-03-01 NOTE — Progress Notes (Signed)
03/01/2015 2:54 PM   Rose Luna [birth year] [id number]  Referring provider: [facility] [address] [place], [place] [address]  Chief Complaint  Patient presents with  . Follow-up    abscess groin area x 4 days    HPI: [name] is a 60yo with a hx of nephrolithiasis here for followup. She underwent stent placement 2 weeks ago and is scheduled for URS on 3/8.  Yesterday she noticed right abdominal wall swelling and pain. She noted purulent drainage from the wound this morning. No fevers    PMH: Past Medical History  Diagnosis Date  . Diabetes mellitus without complication (HCC)   . Type 2 diabetes mellitus (HCC) 04/18/2009  . Acute cystitis 09/19/2014  . Injury of kidney 09/19/2014  . Uremia 09/19/2014  . Infection of urinary tract 09/19/2014  . Elevated erythrocyte sedimentation rate 09/22/2014  . HLD (hyperlipidemia) 04/19/2009  . Asthma   . History of renal stent   . Atrial fibrillation [facility])     Surgical History: Past Surgical History  Procedure Laterality Date  . Cesarean section    . Urethral stent    . Hysterotomy    . Abdominal hysterectomy    . Ureteroscopy with holmium laser lithotripsy Left 02/16/2015    Procedure: ATTEMPTED URETEROSCOPY ;  Surgeon: Hildred Laser, MD;  Location: [facility] ORS;  Service: Urology;  Laterality: Left;  . Cystoscopy w/ ureteral stent placement Left 02/16/2015    Procedure: CYSTOSCOPY WITH STENT REPLACEMENT;  Surgeon: Hildred Laser, MD;  Location: [facility] ORS;  Service: Urology;  Laterality: Left;  . Cystoscopy w/ retrogrades Left 02/16/2015    Procedure: CYSTOSCOPY WITH RETROGRADE PYELOGRAM;  Surgeon: Hildred Laser, MD;  Location: [facility] ORS;  Service: Urology;  Laterality: Left;    Home Medications:    Medication List       This list is accurate as of: 03/01/15  2:54 PM.  Always use your most recent med list.               atorvastatin 40 MG tablet  Commonly known as:  LIPITOR    Take 40 mg by mouth daily at 6 PM.     CALCIUM + D3 PO  Take by mouth.     celecoxib 200 MG capsule  Commonly known as:  CELEBREX  Take 200 mg by mouth 2 (two) times daily.     fenofibrate 145 MG tablet  Commonly known as:  TRICOR  Take 145 mg by mouth daily.     fluticasone 110 MCG/ACT inhaler  Commonly known as:  FLOVENT HFA  Inhale 1 puff into the lungs 2 (two) times daily.     gabapentin 300 MG capsule  Commonly known as:  NEURONTIN  Take by mouth 2 (two) times daily. Take 2 capsules (600 mg) every morning and 3 capsules (900 mg) at bedtime     gemfibrozil 600 MG tablet  Commonly known as:  LOPID  Take 600 mg by mouth 2 (two) times daily. Reported on 02/25/2015     insulin detemir 100 UNIT/ML injection  Commonly known as:  LEVEMIR  Inject 45 Units into the skin 2 (two) times daily.     Insulin Glargine 100 UNIT/ML Solostar Pen  Commonly known as:  LANTUS  Inject 50 Units into the skin 2 (two) times daily.     metFORMIN 1000 MG tablet  Commonly known as:  GLUCOPHAGE  Take 1,000 mg by mouth 2 (two) times daily.  metoprolol tartrate 25 MG tablet  Commonly known as:  LOPRESSOR  Take 1 tablet (25 mg total) by mouth 2 (two) times daily.     ondansetron 4 MG tablet  Commonly known as:  ZOFRAN  Take 1 tablet (4 mg total) by mouth every 8 (eight) hours as needed for nausea or vomiting.     oxyCODONE-acetaminophen 5-325 MG tablet  Commonly known as:  ROXICET  Take 1 tablet by mouth every 4 (four) hours as needed for severe pain.     ranitidine 150 MG tablet  Commonly known as:  ZANTAC  Take 150 mg by mouth 2 (two) times daily.     VICTOZA 18 MG/3ML Sopn  Generic drug:  Liraglutide  Inject 1.8 mg into the skin daily at 12 noon. At lunch time.        Allergies:  Allergies  Allergen Reactions  . Hydrocodone Nausea And Vomiting    Family History: Family History  Problem Relation Age of Onset  . Adopted: Yes  . Other      patient adopted    Social  History:  reports that she has been smoking Cigarettes.  She has been smoking about 1.00 pack per day. She has never used smokeless tobacco. She reports that she does not drink alcohol or use illicit drugs.  ROS:                                        Physical Exam: BP 128/78 mmHg  Pulse 73  Ht  (1.626 m)  Wt 108.228 kg (238 lb 9.6 oz)  BMI 40.94 kg/m2  Constitutional:  Alert and oriented, No acute distress. HEENT: [place] AT, moist mucus membranes.  Trachea midline, no masses. Cardiovascular: No clubbing, cyanosis, or edema. Respiratory: Normal respiratory effort, no increased work of breathing. GI: Abdomen is soft, nontender, nondistended, no abdominal masses. RLQ abdominal wall abscess, 1cm GU: No CVA tenderness.  Skin: No rashes, bruises or suspicious lesions. Lymph: No cervical or inguinal adenopathy. Neurologic: Grossly intact, no focal deficits, moving all 4 extremities. Psychiatric: Normal mood and affect.  Laboratory Data: Lab Results  Component Value Date   WBC 13.6* 02/25/2015   HGB 13.8 02/25/2015   HCT 42.3 02/25/2015   MCV 88.0 02/25/2015   PLT 285 02/25/2015    Lab Results  Component Value Date   CREATININE 0.96 02/25/2015    No results found for: PSA  No results found for: TESTOSTERONE  No results found for: HGBA1C  Urinalysis    Component Value Date/Time   COLORURINE YELLOW* 01/07/2015 1057   APPEARANCEUR HAZY* 01/07/2015 1057   LABSPEC 1.011 01/07/2015 1057   PHURINE 6.0 01/07/2015 1057   GLUCOSEU Negative 01/18/2015 1339   HGBUR 2+* 01/07/2015 1057   BILIRUBINUR Negative 01/18/2015 1339   BILIRUBINUR NEGATIVE 01/07/2015 1057   KETONESUR NEGATIVE 01/07/2015 1057   PROTEINUR 30* 01/07/2015 1057   NITRITE Negative 01/18/2015 1339   NITRITE NEGATIVE 01/07/2015 1057   LEUKOCYTESUR 2+* 01/18/2015 1339   LEUKOCYTESUR 2+* 01/07/2015 1057    Pertinent Imaging: none  Assessment & Plan:   1. Abdominal wall  abscess -drained and guaze placed over the wound -Bactrim DS BID for 7 days  There are no diagnoses linked to this encounter.  No Follow-up on file.  Malen Gauze, MD  [facility] [address], [address] [place], [place] [address] [phone]

## 2015-03-02 DIAGNOSIS — L02211 Cutaneous abscess of abdominal wall: Secondary | ICD-10-CM | POA: Insufficient documentation

## 2015-03-02 LAB — URINALYSIS, COMPLETE
BILIRUBIN UA: NEGATIVE
Glucose, UA: NEGATIVE
Ketones, UA: NEGATIVE
Nitrite, UA: NEGATIVE
PH UA: 7.5 (ref 5.0–7.5)
SPEC GRAV UA: 1.02 (ref 1.005–1.030)
Urobilinogen, Ur: 0.2 mg/dL (ref 0.2–1.0)

## 2015-03-02 LAB — MICROSCOPIC EXAMINATION
EPITHELIAL CELLS (NON RENAL): NONE SEEN /HPF (ref 0–10)
RBC, UA: >30 /hpf — AB (ref 0–?)

## 2015-03-03 LAB — CULTURE, URINE COMPREHENSIVE

## 2015-03-09 ENCOUNTER — Ambulatory Visit: Payer: Self-pay | Admitting: Anesthesiology

## 2015-03-09 ENCOUNTER — Encounter
Admission: RE | Disposition: A | Discharge status: Home / Self Care | Payer: Self-pay | Source: Ambulatory Visit | Attending: Urology

## 2015-03-09 ENCOUNTER — Encounter: Payer: Self-pay | Admitting: *Deleted

## 2015-03-09 ENCOUNTER — Ambulatory Visit
Admission: RE | Admit: 2015-03-09 | Discharge: 2015-03-09 | Disposition: A | Discharge status: Home / Self Care | Payer: Self-pay | Source: Ambulatory Visit | Attending: Urology | Admitting: Urology

## 2015-03-09 DIAGNOSIS — E119 Type 2 diabetes mellitus without complications: Secondary | ICD-10-CM | POA: Insufficient documentation

## 2015-03-09 DIAGNOSIS — Z87442 Personal history of urinary calculi: Secondary | ICD-10-CM | POA: Insufficient documentation

## 2015-03-09 DIAGNOSIS — F17210 Nicotine dependence, cigarettes, uncomplicated: Secondary | ICD-10-CM | POA: Insufficient documentation

## 2015-03-09 DIAGNOSIS — J45909 Unspecified asthma, uncomplicated: Secondary | ICD-10-CM | POA: Insufficient documentation

## 2015-03-09 DIAGNOSIS — L02211 Cutaneous abscess of abdominal wall: Secondary | ICD-10-CM

## 2015-03-09 DIAGNOSIS — Z885 Allergy status to narcotic agent status: Secondary | ICD-10-CM | POA: Insufficient documentation

## 2015-03-09 DIAGNOSIS — E785 Hyperlipidemia, unspecified: Secondary | ICD-10-CM | POA: Insufficient documentation

## 2015-03-09 DIAGNOSIS — I4891 Unspecified atrial fibrillation: Secondary | ICD-10-CM | POA: Insufficient documentation

## 2015-03-09 DIAGNOSIS — N200 Calculus of kidney: Secondary | ICD-10-CM | POA: Insufficient documentation

## 2015-03-09 DIAGNOSIS — Z794 Long term (current) use of insulin: Secondary | ICD-10-CM | POA: Insufficient documentation

## 2015-03-09 HISTORY — PX: CYSTOSCOPY WITH STENT PLACEMENT: SHX5790

## 2015-03-09 HISTORY — PX: URETEROSCOPY WITH HOLMIUM LASER LITHOTRIPSY: SHX6645

## 2015-03-09 LAB — GLUCOSE, CAPILLARY
GLUCOSE-CAPILLARY: 110 mg/dL — AB (ref 65–99)
Glucose-Capillary: 107 mg/dL — ABNORMAL HIGH (ref 65–99)

## 2015-03-09 SURGERY — URETEROSCOPY, WITH LITHOTRIPSY USING HOLMIUM LASER
Anesthesia: General | Laterality: Left | Wound class: Clean Contaminated

## 2015-03-09 MED ORDER — MIDAZOLAM HCL 5 MG/5ML IJ SOLN
INTRAMUSCULAR | Status: DC | PRN
Start: 2015-03-09 — End: 2015-03-09
  Administered 2015-03-09: 2 mg via INTRAVENOUS

## 2015-03-09 MED ORDER — FENTANYL CITRATE (PF) 100 MCG/2ML IJ SOLN
INTRAMUSCULAR | Status: DC | PRN
  Administered 2015-03-09: 100 ug via INTRAVENOUS

## 2015-03-09 MED ORDER — ROCURONIUM BROMIDE 100 MG/10ML IV SOLN
INTRAVENOUS | Status: DC | PRN
  Administered 2015-03-09 (×2): 10 mg via INTRAVENOUS

## 2015-03-09 MED ORDER — ONDANSETRON HCL 4 MG/2ML IJ SOLN: 4.0000 mg | Freq: Once | INTRAMUSCULAR | Status: DC | PRN

## 2015-03-09 MED ORDER — PROPOFOL 10 MG/ML IV BOLUS
INTRAVENOUS | Status: DC | PRN
  Administered 2015-03-09: 170 mg via INTRAVENOUS

## 2015-03-09 MED ORDER — LIDOCAINE HCL (CARDIAC) 20 MG/ML IV SOLN
INTRAVENOUS | Status: DC | PRN
  Administered 2015-03-09: 60 mg via INTRAVENOUS

## 2015-03-09 MED ORDER — SUGAMMADEX SODIUM 200 MG/2ML IV SOLN
INTRAVENOUS | Status: DC | PRN
  Administered 2015-03-09: 200 mg via INTRAVENOUS

## 2015-03-09 MED ORDER — SUCCINYLCHOLINE CHLORIDE 20 MG/ML IJ SOLN
INTRAMUSCULAR | Status: DC | PRN
  Administered 2015-03-09: 100 mg via INTRAVENOUS

## 2015-03-09 MED ORDER — SULFAMETHOXAZOLE-TRIMETHOPRIM 800-160 MG PO TABS: 1.0000 | ORAL_TABLET | Freq: Two times a day (BID) | ORAL | Status: DC

## 2015-03-09 MED ORDER — OXYCODONE-ACETAMINOPHEN 5-325 MG PO TABS: 1.0000 | ORAL_TABLET | ORAL | Status: DC | PRN

## 2015-03-09 MED ORDER — GENTAMICIN SULFATE 40 MG/ML IJ SOLN
120.0000 mg | Freq: Once | INTRAMUSCULAR | Status: AC
  Administered 2015-03-09: 120 mg via INTRAVENOUS
  Filled 2015-03-09: qty 3

## 2015-03-09 MED ORDER — ONDANSETRON HCL 4 MG/2ML IJ SOLN
INTRAMUSCULAR | Status: DC | PRN
  Administered 2015-03-09: 4 mg via INTRAVENOUS

## 2015-03-09 MED ORDER — FENTANYL CITRATE (PF) 100 MCG/2ML IJ SOLN: 25.0000 ug | INTRAMUSCULAR | Status: DC | PRN

## 2015-03-09 MED ORDER — PROPOFOL 10 MG/ML IV BOLUS: INTRAVENOUS | Status: DC | PRN

## 2015-03-09 MED ORDER — SODIUM CHLORIDE 0.9 % IV SOLN
INTRAVENOUS | Status: DC
  Administered 2015-03-09 (×2): via INTRAVENOUS

## 2015-03-09 MED ORDER — SODIUM CHLORIDE 0.9 % IV SOLN
2.0000 g | Freq: Once | INTRAVENOUS | Status: AC
  Administered 2015-03-09: 2 g via INTRAVENOUS
  Filled 2015-03-09: qty 2000

## 2015-03-09 SURGICAL SUPPLY — 29 items
BACTOSHIELD CHG 4% 4OZ (MISCELLANEOUS) ×2
BASKET LASER NITINOL 1.9FR (BASKET) ×3 IMPLANT
BASKET ZERO TIP 1.9FR (BASKET) ×3 IMPLANT
CATH URETL 5X70 OPEN END (CATHETERS) ×3 IMPLANT
CNTNR SPEC 2.5X3XGRAD LEK (MISCELLANEOUS) ×1
CONT SPEC 4OZ STER OR WHT (MISCELLANEOUS) ×2
CONTAINER SPEC 2.5X3XGRAD LEK (MISCELLANEOUS) ×1 IMPLANT
GLOVE BIO SURGEON STRL SZ7 (GLOVE) ×6 IMPLANT
GLOVE BIO SURGEON STRL SZ7.5 (GLOVE) ×3 IMPLANT
GOWN STRL REUS W/ TWL LRG LVL4 (GOWN DISPOSABLE) ×1 IMPLANT
GOWN STRL REUS W/TWL LRG LVL4 (GOWN DISPOSABLE) ×2
GOWN STRL REUS W/TWL XL LVL3 (GOWN DISPOSABLE) ×3 IMPLANT
GUIDEWIRE SUPER STIFF (WIRE) IMPLANT
KIT RM TURNOVER CYSTO AR (KITS) ×3 IMPLANT
LASER FIBER 200M SMARTSCOPE (Laser) ×3 IMPLANT
LASER HOLMIUM FIBER SU 272UM (MISCELLANEOUS) IMPLANT
PACK CYSTO AR (MISCELLANEOUS) ×3 IMPLANT
SCRUB CHG 4% DYNA-HEX 4OZ (MISCELLANEOUS) ×1 IMPLANT
SENSORWIRE 0.038 NOT ANGLED (WIRE) ×3
SET CYSTO W/LG BORE CLAMP LF (SET/KITS/TRAYS/PACK) ×3 IMPLANT
SHEATH URETERAL 13/15X36 1L (SHEATH) ×3 IMPLANT
SOL .9 NS 3000ML IRR  AL (IV SOLUTION) ×2
SOL .9 NS 3000ML IRR UROMATIC (IV SOLUTION) ×1 IMPLANT
STENT URET 6FRX24 CONTOUR (STENTS) ×3 IMPLANT
STENT URET 6FRX26 CONTOUR (STENTS) ×3 IMPLANT
SURGILUBE 2OZ TUBE FLIPTOP (MISCELLANEOUS) ×3 IMPLANT
SYRINGE IRR TOOMEY STRL 70CC (SYRINGE) ×3 IMPLANT
WATER STERILE IRR 1000ML POUR (IV SOLUTION) ×3 IMPLANT
WIRE SENSOR 0.038 NOT ANGLED (WIRE) ×1 IMPLANT

## 2015-03-09 NOTE — Transfer of Care (Signed)
Immediate Anesthesia Transfer of Care Note  Patient: Michaell CowingCheri J Zoll  Procedure(s) Performed: Procedure(s): URETEROSCOPY WITH HOLMIUM LASER LITHOTRIPSY (Left) CYSTOSCOPY WITH STENT PLACEMENT/ EXCHANGE /STONE BASKETING (Left)  Patient Location: PACU  Anesthesia Type:General  Level of Consciousness: sedated  Airway & Oxygen Therapy: Patient Spontanous Breathing and Patient connected to face mask oxygen  Post-op Assessment: Report given to RN  Post vital signs: Reviewed and stable  Last Vitals:  Filed Vitals:   03/09/15 1510 03/09/15 1806  BP: 126/88 132/57  Pulse: 74 75  Temp: 36.3 C 36.7 C  Resp: 16 26    Complications: No apparent anesthesia complications

## 2015-03-09 NOTE — Discharge Instructions (Signed)

## 2015-03-09 NOTE — Anesthesia Preprocedure Evaluation (Signed)
Anesthesia Evaluation  Patient identified by MRN, date of birth, ID band Patient awake    Reviewed: Allergy & Precautions, NPO status , Patient's Chart, lab work & pertinent test results  Airway Mallampati: III       Dental  (+) Poor Dentition   Pulmonary asthma , Current Smoker,     + decreased breath sounds      Cardiovascular hypertension, Pt. on home beta blockers + CAD   Rhythm:Regular     Neuro/Psych    GI/Hepatic negative GI ROS, Neg liver ROS,   Endo/Other  diabetes, Type 1, Insulin DependentMorbid obesity  Renal/GU      Musculoskeletal   Abdominal   Peds  Hematology   Anesthesia Other Findings   Reproductive/Obstetrics                             Anesthesia Physical Anesthesia Plan  ASA: III  Anesthesia Plan: General   Post-op Pain Management:    Induction: Intravenous  Airway Management Planned: Oral ETT  Additional Equipment:   Intra-op Plan:   Post-operative Plan: Extubation in OR  Informed Consent: I have reviewed the patients History and Physical, chart, labs and discussed the procedure including the risks, benefits and alternatives for the proposed anesthesia with the patient or authorized representative who has indicated his/her understanding and acceptance.     Plan Discussed with: CRNA  Anesthesia Plan Comments:         Anesthesia Quick Evaluation

## 2015-03-09 NOTE — Op Note (Signed)
Date of procedure: 03/09/2015  Preoperative diagnosis:  1. Left renal stone   Postoperative diagnosis:  1. Left renal stone   Procedure: 1. Cystoscopy 2. Left ureteroscopy 3. Laser lithotripsy 4. Stone basketing 5. Left renal stent exchange 6 [place] by 24 cm  Surgeon: Baruch Gouty, MD  Anesthesia: General  Complications: None  Intraoperative findings: Patient had a stone in the left lower pole that was very difficult access. He was broken to small fragments larger one was sent to pathology. The remaining stone was dusted.  EBL:   None  Specimens: Left renal stone  Drains: 6 French by 24 cm left ureteral stent  Disposition: Stable to the postanesthesia care unit  Indication for procedure: The patient is a 60 y.o. female with left lower pole stone 7 mm in size presents for definitive management.  After reviewing the management options for treatment, the patient elected to proceed with the above surgical procedure(s). We have discussed the potential benefits and risks of the procedure, side effects of the proposed treatment, the likelihood of the patient achieving the goals of the procedure, and any potential problems that might occur during the procedure or recuperation. Informed consent has been obtained.  Description of procedure: The patient was met in the preoperative area. All risks, benefits, and indications of the procedure were described in great detail. The patient consented to the procedure. Preoperative antibiotics were given. The patient was taken to the operative theater. General anesthesia was induced per the anesthesia service. The patient was then placed in the dorsal lithotomy position and prepped and draped in the usual sterile fashion. A preoperative timeout was called. A 21 French 30 cystoscope was inserted in the patient's bladder per urethra atraumatically. The left renal stent was removed with flexible graspers. A sensor wire was exchanged through the stent  and the stent removed. A sensor wire was in the renal pelvis on fluoroscopy. Using a dual-lumen catheter, second sensor wires placed next to the first sensor wire up to the level of the renal pelvis on fluoroscopy. The dual-lumen catheter was removed. A ureteral access sheath was then placed over the sensor wire with little resistance under fluoroscopy. The inner sheath and the sensor wire removed. A flexible ureteroscope was then placed up to the level of the renal pelvis. Pan nephroscopy was only remarkable for stone left lower pole. It is very difficult to reach the stone. Attempted to basket the stone removed to the upper pole for lithotripsy, however this wasn't possible. I decided to break it up in the lower pole stone. This was very difficult. I broke up into the fragments. I removed one fragment with a stone basket and sent stone to pathology. The remaining stone was dusted. There were no residual fragments of significant size at the end procedure. The ureteral access sheath was looked out under direct visualization. The cystoscope was reassembled over the remaining sensor wire. A 6 French by 24 cm double-J ureteral stent was then placed and the sensor wire removed. It was confirmed in the correct position with a curl seen in the patient's renal pelvis on fluoroscopy and a curl seen in the urinary bladder and rectal invasion. The patient's bladder was drained she was awoken from anesthesia and transferred in stable condition to the postanesthesia care unit.  Plan: The patient will follow-up in one week for office cystoscopy to remove the stent. She will need a ultrasound in 1 month rule out iatrogenic nephrosis.  Baruch Gouty, M.D.

## 2015-03-09 NOTE — H&P (View-Only) (Signed)
03/01/2015 2:54 PM   [name] 1955/10/17 [id number]  Referring provider: Phineas Real [facility] [address] [place], [place] [address]  Chief Complaint  Patient presents with  . Follow-up    abscess groin area x 4 days    HPI: [name] is a 60yo with a hx of nephrolithiasis here for followup. She underwent stent placement 2 weeks ago and is scheduled for URS on 3/8.  Yesterday she noticed right abdominal wall swelling and pain. She noted purulent drainage from the wound this morning. No fevers    PMH: Past Medical History  Diagnosis Date  . Diabetes mellitus without complication (HCC)   . Type 2 diabetes mellitus (HCC) 04/18/2009  . Acute cystitis 09/19/2014  . Injury of kidney 09/19/2014  . Uremia 09/19/2014  . Infection of urinary tract 09/19/2014  . Elevated erythrocyte sedimentation rate 09/22/2014  . HLD (hyperlipidemia) 04/19/2009  . Asthma   . History of renal stent   . Atrial fibrillation [facility])     Surgical History: Past Surgical History  Procedure Laterality Date  . Cesarean section    . Urethral stent    . Hysterotomy    . Abdominal hysterectomy    . Ureteroscopy with holmium laser lithotripsy Left 02/16/2015    Procedure: ATTEMPTED URETEROSCOPY ;  Surgeon: Hildred Laser, MD;  Location: [facility] ORS;  Service: Urology;  Laterality: Left;  . Cystoscopy w/ ureteral stent placement Left 02/16/2015    Procedure: CYSTOSCOPY WITH STENT REPLACEMENT;  Surgeon: Hildred Laser, MD;  Location: [facility] ORS;  Service: Urology;  Laterality: Left;  . Cystoscopy w/ retrogrades Left 02/16/2015    Procedure: CYSTOSCOPY WITH RETROGRADE PYELOGRAM;  Surgeon: Hildred Laser, MD;  Location: [facility] ORS;  Service: Urology;  Laterality: Left;    Home Medications:    Medication List       This list is accurate as of: 03/01/15  2:54 PM.  Always use your most recent med list.               atorvastatin 40 MG tablet  Commonly known as:  LIPITOR    Take 40 mg by mouth daily at 6 PM.     CALCIUM + D3 PO  Take by mouth.     celecoxib 200 MG capsule  Commonly known as:  CELEBREX  Take 200 mg by mouth 2 (two) times daily.     fenofibrate 145 MG tablet  Commonly known as:  TRICOR  Take 145 mg by mouth daily.     fluticasone 110 MCG/ACT inhaler  Commonly known as:  FLOVENT HFA  Inhale 1 puff into the lungs 2 (two) times daily.     gabapentin 300 MG capsule  Commonly known as:  NEURONTIN  Take by mouth 2 (two) times daily. Take 2 capsules (600 mg) every morning and 3 capsules (900 mg) at bedtime     gemfibrozil 600 MG tablet  Commonly known as:  LOPID  Take 600 mg by mouth 2 (two) times daily. Reported on 02/25/2015     insulin detemir 100 UNIT/ML injection  Commonly known as:  LEVEMIR  Inject 45 Units into the skin 2 (two) times daily.     Insulin Glargine 100 UNIT/ML Solostar Pen  Commonly known as:  LANTUS  Inject 50 Units into the skin 2 (two) times daily.     metFORMIN 1000 MG tablet  Commonly known as:  GLUCOPHAGE  Take 1,000 mg by mouth 2 (two) times daily.  metoprolol tartrate 25 MG tablet  Commonly known as:  LOPRESSOR  Take 1 tablet (25 mg total) by mouth 2 (two) times daily.     ondansetron 4 MG tablet  Commonly known as:  ZOFRAN  Take 1 tablet (4 mg total) by mouth every 8 (eight) hours as needed for nausea or vomiting.     oxyCODONE-acetaminophen 5-325 MG tablet  Commonly known as:  ROXICET  Take 1 tablet by mouth every 4 (four) hours as needed for severe pain.     ranitidine 150 MG tablet  Commonly known as:  ZANTAC  Take 150 mg by mouth 2 (two) times daily.     VICTOZA 18 MG/3ML Sopn  Generic drug:  Liraglutide  Inject 1.8 mg into the skin daily at 12 noon. At lunch time.        Allergies:  Allergies  Allergen Reactions  . Hydrocodone Nausea And Vomiting    Family History: Family History  Problem Relation Age of Onset  . Adopted: Yes  . Other      patient adopted    Social  History:  reports that she has been smoking Cigarettes.  She has been smoking about 1.00 pack per day. She has never used smokeless tobacco. She reports that she does not drink alcohol or use illicit drugs.  ROS:                                        Physical Exam: BP 128/78 mmHg  Pulse 73  Ht 5\' 4"  (1.626 m)  Wt 108.228 kg (238 lb 9.6 oz)  BMI 40.94 kg/m2  Constitutional:  Alert and oriented, No acute distress. HEENT: [place] AT, moist mucus membranes.  Trachea midline, no masses. Cardiovascular: No clubbing, cyanosis, or edema. Respiratory: Normal respiratory effort, no increased work of breathing. GI: Abdomen is soft, nontender, nondistended, no abdominal masses. RLQ abdominal wall abscess, 1cm GU: No CVA tenderness.  Skin: No rashes, bruises or suspicious lesions. Lymph: No cervical or inguinal adenopathy. Neurologic: Grossly intact, no focal deficits, moving all 4 extremities. Psychiatric: Normal mood and affect.  Laboratory Data: Lab Results  Component Value Date   WBC 13.6* 02/25/2015   HGB 13.8 02/25/2015   HCT 42.3 02/25/2015   MCV 88.0 02/25/2015   PLT 285 02/25/2015    Lab Results  Component Value Date   CREATININE 0.96 02/25/2015    No results found for: PSA  No results found for: TESTOSTERONE  No results found for: HGBA1C  Urinalysis    Component Value Date/Time   COLORURINE YELLOW* 01/07/2015 1057   APPEARANCEUR HAZY* 01/07/2015 1057   LABSPEC 1.011 01/07/2015 1057   PHURINE 6.0 01/07/2015 1057   GLUCOSEU Negative 01/18/2015 1339   HGBUR 2+* 01/07/2015 1057   BILIRUBINUR Negative 01/18/2015 1339   BILIRUBINUR NEGATIVE 01/07/2015 1057   KETONESUR NEGATIVE 01/07/2015 1057   PROTEINUR 30* 01/07/2015 1057   NITRITE Negative 01/18/2015 1339   NITRITE NEGATIVE 01/07/2015 1057   LEUKOCYTESUR 2+* 01/18/2015 1339   LEUKOCYTESUR 2+* 01/07/2015 1057    Pertinent Imaging: none  Assessment & Plan:   1. Abdominal wall  abscess -drained and guaze placed over the wound -Bactrim DS BID for 7 days  There are no diagnoses linked to this encounter.  No Follow-up on file.  Malen GauzeMCKENZIE, Kaston Faughn L, MD  [facility] [address], [address] [place], [place] [address] [phone]

## 2015-03-09 NOTE — Interval H&P Note (Signed)
History and Physical Interval Note:  03/09/2015 3:56 PM  Rose Luna  has presented today for surgery, with the diagnosis of LEFT RENAL STONE  The various methods of treatment have been discussed with the patient and family. After consideration of risks, benefits and other options for treatment, the patient has consented to  Procedure(s): URETEROSCOPY WITH HOLMIUM LASER LITHOTRIPSY (Left) CYSTOSCOPY WITH STENT PLACEMENT/ EXCHANGE /STONE BASKETING (Left) as a surgical intervention .  The patient's history has been reviewed, patient examined, no change in status, stable for surgery.  I have reviewed the patient's chart and labs.  Questions were answered to the patient's satisfaction.    The patient presents for repeat URS after equipment malfunction during last procedure  RRR Unlabored respirations Cloyde ReamsBrian Robarts BasinBudzyn

## 2015-03-09 NOTE — Anesthesia Procedure Notes (Signed)
Procedure Name: Intubation Date/Time: 03/09/2015 5:00 PM Performed by: Lily KocherPERALTA, Shirlyn Savin Pre-anesthesia Checklist: Patient identified, Patient being monitored, Timeout performed, Emergency Drugs available and Suction available Patient Re-evaluated:Patient Re-evaluated prior to inductionOxygen Delivery Method: Circle system utilized Preoxygenation: Pre-oxygenation with 100% oxygen Intubation Type: IV induction Ventilation: Mask ventilation without difficulty Laryngoscope Size: Mac and 3 Grade View: Grade III Tube type: Oral Tube size: 7.0 mm Number of attempts: 1 Airway Equipment and Method: Stylet Placement Confirmation: positive ETCO2 and breath sounds checked- equal and bilateral Secured at: 21 cm Tube secured with: Tape Dental Injury: Teeth and Oropharynx as per pre-operative assessment

## 2015-03-10 ENCOUNTER — Telehealth: Payer: Self-pay

## 2015-03-10 ENCOUNTER — Encounter: Payer: Self-pay | Admitting: Urology

## 2015-03-10 NOTE — Telephone Encounter (Signed)
-----   Message from Hildred LaserBrian Nestle Budzyn, MD sent at 03/09/2015  5:59 PM EST ----- Patient needs to follow up in week for cysto stent removal in the office. thanks

## 2015-03-11 NOTE — Anesthesia Postprocedure Evaluation (Signed)
Anesthesia Post Note  Patient: [name]  Procedure(s) Performed: Procedure(s) (LRB): URETEROSCOPY WITH HOLMIUM LASER LITHOTRIPSY (Left) CYSTOSCOPY WITH STENT PLACEMENT/ EXCHANGE /STONE BASKETING (Left)  Patient location during evaluation: PACU Anesthesia Type: General Level of consciousness: awake Pain management: pain level controlled Vital Signs Assessment: post-procedure vital signs reviewed and stable Respiratory status: spontaneous breathing Cardiovascular status: blood pressure returned to baseline Anesthetic complications: no    Last Vitals:  Filed Vitals:   03/09/15 1855 03/09/15 1931  BP: 121/68 124/54  Pulse: 72 80  Temp: 36.8 C 36.7 C  Resp: 16 16    Last Pain:  Filed Vitals:   03/09/15 1936  PainSc: Asleep                 VAN STAVEREN,Gabriell Daigneault

## 2015-03-11 NOTE — Telephone Encounter (Signed)
Done  Lm for pt to cb to give her the appt informmation  michelle

## 2015-03-16 LAB — STONE ANALYSIS
CA OXALATE, MONOHYDR.: 90 %
CA PHOS CRY STONE QL IR: 10 %
STONE WEIGHT KSTONE: 12 mg

## 2015-03-18 NOTE — Telephone Encounter (Signed)
Appointment was scheduled.

## 2015-03-22 ENCOUNTER — Ambulatory Visit: Payer: Self-pay

## 2015-03-23 ENCOUNTER — Other Ambulatory Visit: Payer: Self-pay

## 2015-03-23 NOTE — Telephone Encounter (Signed)
Receive fax from pharmacy to refill atorvastatin

## 2015-03-24 ENCOUNTER — Other Ambulatory Visit: Payer: Self-pay | Admitting: Urology

## 2015-03-24 ENCOUNTER — Ambulatory Visit: Payer: Self-pay | Admitting: Urology

## 2015-03-24 DIAGNOSIS — N200 Calculus of kidney: Secondary | ICD-10-CM

## 2015-03-24 LAB — MICROSCOPIC EXAMINATION

## 2015-03-24 LAB — URINALYSIS, COMPLETE
BILIRUBIN UA: NEGATIVE
GLUCOSE, UA: NEGATIVE
KETONES UA: NEGATIVE
NITRITE UA: NEGATIVE
SPEC GRAV UA: 1.025 (ref 1.005–1.030)
UUROB: 0.2 mg/dL (ref 0.2–1.0)
pH, UA: 5.5 (ref 5.0–7.5)

## 2015-03-24 MED ORDER — CIPROFLOXACIN HCL 500 MG PO TABS: 500.0000 mg | ORAL_TABLET | Freq: Once | ORAL | Status: DC

## 2015-03-24 MED ORDER — LIDOCAINE HCL 2 % EX GEL: 1.0000 "application " | Freq: Once | CUTANEOUS | Status: DC

## 2015-03-24 MED ORDER — ATORVASTATIN CALCIUM 40 MG PO TABS: 40.0000 mg | ORAL_TABLET | Freq: Every day | ORAL | Status: AC

## 2015-03-26 LAB — CULTURE, URINE COMPREHENSIVE

## 2015-04-07 ENCOUNTER — Other Ambulatory Visit: Payer: Self-pay | Admitting: Urology

## 2015-04-11 ENCOUNTER — Encounter: Payer: Self-pay | Admitting: Urology

## 2015-04-11 ENCOUNTER — Other Ambulatory Visit: Payer: Self-pay | Admitting: Urology

## 2015-04-11 ENCOUNTER — Telehealth: Payer: Self-pay

## 2015-04-11 NOTE — Telephone Encounter (Signed)
I called pt to f/u with her to find out why she No-showed, no answer I left a message on pt vm to return my call.

## 2015-04-12 ENCOUNTER — Encounter: Payer: Self-pay | Admitting: Urology

## 2015-04-12 ENCOUNTER — Ambulatory Visit (INDEPENDENT_AMBULATORY_CARE_PROVIDER_SITE_OTHER): Payer: Self-pay | Admitting: Urology

## 2015-04-12 VITALS — BP 131/81 | HR 77 | Temp 98.6°F | Wt 242.0 lb

## 2015-04-12 DIAGNOSIS — N200 Calculus of kidney: Secondary | ICD-10-CM

## 2015-04-12 LAB — URINALYSIS, COMPLETE
BILIRUBIN UA: NEGATIVE
Ketones, UA: NEGATIVE
Nitrite, UA: POSITIVE — AB
SPEC GRAV UA: 1.025 (ref 1.005–1.030)
UUROB: 0.2 mg/dL (ref 0.2–1.0)
pH, UA: 6 (ref 5.0–7.5)

## 2015-04-12 LAB — MICROSCOPIC EXAMINATION: RBC, UA: >30 /hpf — AB (ref 0–?)

## 2015-04-12 MED ORDER — LIDOCAINE HCL 2 % EX GEL
1.0000 "application " | Freq: Once | CUTANEOUS | Status: AC
  Administered 2015-04-12: 1 via URETHRAL

## 2015-04-12 MED ORDER — CIPROFLOXACIN HCL 500 MG PO TABS
500.0000 mg | ORAL_TABLET | Freq: Once | ORAL | Status: AC
Start: 2015-04-12 — End: 2015-04-12
  Administered 2015-04-12: 500 mg via ORAL

## 2015-04-12 NOTE — Progress Notes (Signed)
    Cystoscopy Procedure Note  Patient identification was confirmed, informed consent was obtained, and patient was prepped using Betadine solution.  Lidocaine jelly was administered per urethral meatus.    Preoperative abx where received prior to procedure.    Procedure: - Flexible cystoscope introduced, without any difficulty.   - Thorough search of the bladder revealed:    normal urethral meatus    normal urothelium    no stones    no ulcers     no tumors    no urethral polyps    no trabeculation  - Ureteral orifices were normal in position and appearance. Using a grasper the left ureteral stent was removed intact  Post-Procedure: - Patient tolerated the procedure well  Plan: Followup with Dr. Sherryl BartersBudzyn in 1 month with renal [place]

## 2015-04-18 ENCOUNTER — Other Ambulatory Visit: Payer: Self-pay | Admitting: Internal Medicine

## 2015-04-19 ENCOUNTER — Ambulatory Visit: Payer: Self-pay | Admitting: Physician Assistant

## 2015-04-19 DIAGNOSIS — E1159 Type 2 diabetes mellitus with other circulatory complications: Secondary | ICD-10-CM

## 2015-04-19 DIAGNOSIS — I739 Peripheral vascular disease, unspecified: Secondary | ICD-10-CM

## 2015-04-19 DIAGNOSIS — I2510 Atherosclerotic heart disease of native coronary artery without angina pectoris: Secondary | ICD-10-CM

## 2015-04-19 MED ORDER — METOPROLOL TARTRATE 25 MG PO TABS: 25.0000 mg | ORAL_TABLET | Freq: Two times a day (BID) | ORAL | Status: AC

## 2015-04-19 MED ORDER — INSULIN GLARGINE 100 UNIT/ML SOLOSTAR PEN: 40.0000 [IU] | PEN_INJECTOR | Freq: Two times a day (BID) | SUBCUTANEOUS | Status: DC

## 2015-04-19 NOTE — Progress Notes (Signed)
[facility] Progress [facility]  04/19/2015  Name: Rose CowingCheri J Luna  MRN: [id number]  Date of Birth: 03/11/1955  Chief Complaint: No chief complaint on file.   HPI: HPI  Past Medical History:  Past Medical History  Diagnosis Date  . Diabetes mellitus without complication (HCC)   . Type 2 diabetes mellitus (HCC) 04/18/2009  . Acute cystitis 09/19/2014  . Injury of kidney 09/19/2014  . Uremia 09/19/2014  . Infection of urinary tract 09/19/2014  . Elevated erythrocyte sedimentation rate 09/22/2014  . HLD (hyperlipidemia) 04/19/2009  . Asthma   . History of renal stent   . Atrial fibrillation (HCC)   . GERD (gastroesophageal reflux disease)   . Hypertension     Past Surgical History:  Past Surgical History  Procedure Laterality Date  . Cesarean section    . Urethral stent    . Hysterotomy    . Abdominal hysterectomy    . Ureteroscopy with holmium laser lithotripsy Left 02/16/2015    Procedure: ATTEMPTED URETEROSCOPY ;  Surgeon: Hildred LaserBrian Schaller Budzyn, MD;  Location: [facility] ORS;  Service: Urology;  Laterality: Left;  . Cystoscopy w/ ureteral stent placement Left 02/16/2015    Procedure: CYSTOSCOPY WITH STENT REPLACEMENT;  Surgeon: Hildred LaserBrian Hattabaugh Budzyn, MD;  Location: [facility] ORS;  Service: Urology;  Laterality: Left;  . Cystoscopy w/ retrogrades Left 02/16/2015    Procedure: CYSTOSCOPY WITH RETROGRADE PYELOGRAM;  Surgeon: Hildred LaserBrian Clavin Budzyn, MD;  Location: [facility] OR[facility];  Service: Urology;  Laterality: Left;  . Ureteroscopy with holmium laser lithotripsy Left 03/09/2015    Procedure: URETEROSCOPY WITH HOLMIUM LASER LITHOTRIPSY;  Surgeon: Hildred LaserBrian Justo Budzyn, MD;  Location: [facility] OR[facility];  Service: Urology;  Laterality: Left;  . Cystoscopy with stent placement Left 03/09/2015    Procedure: CYSTOSCOPY WITH STENT PLACEMENT/ EXCHANGE Larina Bras/STONE BASKETING;  Surgeon: Hildred LaserBrian Tieszen Budzyn, MD;  Location: [facility] ORS;  Service: Urology;  Laterality: Left;    Past Family History:  Family History  Problem Relation  Age of Onset  . Adopted: Yes  . Other      patient adopted    Diabetes Management:  Lab Results  Component Value Date   HGBA1C 7.3 01/27/2015    Meter here todayYes - smbg 100-160's  Hypoglycemia in the last 3 monthsNo -  Highest blood sugar seen in last 1-2 months?  ~ 160  Reported blood sugar average:  Trouble with managing blood sugar :  New Complaints: none  Personal Diabetes Goal: none    Clinical Assessment & Plan  Doing well, no changes ERX metoprol and lantus solostart

## 2015-05-02 ENCOUNTER — Ambulatory Visit
Admission: RE | Admit: 2015-05-02 | Discharge: 2015-05-02 | Disposition: A | Discharge status: Home / Self Care | Payer: Self-pay | Source: Ambulatory Visit | Attending: Urology | Admitting: Urology

## 2015-05-02 ENCOUNTER — Other Ambulatory Visit: Payer: Self-pay

## 2015-05-02 DIAGNOSIS — N281 Cyst of kidney, acquired: Secondary | ICD-10-CM | POA: Insufficient documentation

## 2015-05-02 DIAGNOSIS — N200 Calculus of kidney: Secondary | ICD-10-CM | POA: Insufficient documentation

## 2015-05-09 ENCOUNTER — Other Ambulatory Visit: Payer: Self-pay | Admitting: Urology

## 2015-05-09 ENCOUNTER — Other Ambulatory Visit: Payer: Self-pay | Admitting: Nurse Practitioner

## 2015-05-12 ENCOUNTER — Ambulatory Visit: Payer: Self-pay | Admitting: Urology

## 2015-05-16 ENCOUNTER — Other Ambulatory Visit: Payer: Self-pay | Admitting: Nurse Practitioner

## 2015-06-13 ENCOUNTER — Other Ambulatory Visit: Payer: Self-pay | Admitting: Urology

## 2015-06-23 ENCOUNTER — Ambulatory Visit: Payer: Self-pay | Admitting: Ophthalmology

## 2015-06-30 ENCOUNTER — Ambulatory Visit: Payer: Self-pay | Admitting: Ophthalmology

## 2015-07-04 ENCOUNTER — Other Ambulatory Visit: Payer: Self-pay | Admitting: Internal Medicine

## 2015-07-11 ENCOUNTER — Other Ambulatory Visit: Payer: Self-pay | Admitting: Nurse Practitioner

## 2015-07-12 ENCOUNTER — Other Ambulatory Visit: Payer: Self-pay

## 2015-07-12 DIAGNOSIS — E1159 Type 2 diabetes mellitus with other circulatory complications: Secondary | ICD-10-CM

## 2015-07-12 DIAGNOSIS — N200 Calculus of kidney: Secondary | ICD-10-CM

## 2015-07-13 LAB — URINALYSIS, COMPLETE
Bilirubin, UA: NEGATIVE
KETONES UA: NEGATIVE
LEUKOCYTES UA: NEGATIVE
Nitrite, UA: NEGATIVE
RBC, UA: NEGATIVE
SPEC GRAV UA: 1.023 (ref 1.005–1.030)
Urobilinogen, Ur: 0.2 mg/dL (ref 0.2–1.0)
pH, UA: 6 (ref 5.0–7.5)

## 2015-07-13 LAB — HEMOGLOBIN A1C
Est. average glucose Bld gHb Est-mCnc: 232 mg/dL
Hgb A1c MFr Bld: 9.7 % — ABNORMAL HIGH (ref 4.8–5.6)

## 2015-07-13 LAB — MICROSCOPIC EXAMINATION
BACTERIA UA: NONE SEEN
CASTS: NONE SEEN /LPF

## 2015-07-15 LAB — CULTURE, URINE COMPREHENSIVE

## 2015-07-19 ENCOUNTER — Ambulatory Visit: Payer: Self-pay | Admitting: Physician Assistant

## 2015-07-19 VITALS — BP 135/78 | Wt 236.0 lb

## 2015-07-19 DIAGNOSIS — E1159 Type 2 diabetes mellitus with other circulatory complications: Secondary | ICD-10-CM

## 2015-07-19 DIAGNOSIS — E669 Obesity, unspecified: Secondary | ICD-10-CM | POA: Insufficient documentation

## 2015-07-19 DIAGNOSIS — E1142 Type 2 diabetes mellitus with diabetic polyneuropathy: Secondary | ICD-10-CM

## 2015-07-19 DIAGNOSIS — I2510 Atherosclerotic heart disease of native coronary artery without angina pectoris: Secondary | ICD-10-CM

## 2015-07-19 MED ORDER — AMITRIPTYLINE HCL 50 MG PO TABS: ORAL_TABLET | ORAL | Status: AC

## 2015-07-19 NOTE — Progress Notes (Signed)
Assessment:   1. Coronary artery disease involving native coronary artery of native heart without angina pectoris  2. Type 2 diabetes mellitus with other circulatory complication (HCC)   3. Diabetic peripheral neuropathy associated with type 2 diabetes mellitus (HCC)   4. Obesity        Plan:    1.  Rx changes: Liraglutide is at maximum dosage. CKD.   - Add amitriptyline 50-100 mg qhs for DPN. - Continue Lantus at 50u BID consider switching to [place].  2.  Education: Reviewed diabetes management:  Appropriate A1C target, avoid hypoglycemia,  blood pressure (<140/80), and cholesterol (LDL <100). -   Reviewed importance of good glycemic control to reduce risk for complications.   3.   Get regular physical activity at least 30 minutes a day of moderate intensity most days of the week     Subjective:    Rose Luna is a 60 y.o. female who is seen in follow up forType 2 diabetes from 04/19/15   complicated by retinopathy, nephropathy,  neuropathy, obesity, hypertension and dyslipidemia   T2DM Cardiovascular risk factors: hypertension, dyslipidemia, obesity, physical inactivity, albuminuria, chronic kidney disease and tobacco   Eye exam current (within one year): no   Patient's glucometer was and was not downloaded and reviewed SMBG values WAS reviewed with the patient.  Rose Luna is performing SMBG on average 2 times per day.  She says she increased Lantus  From 40u BID to 50u BID 2/2 high FPG 2/2 late night eating due to restless legs. Average over last 150 days is 250. Patient brought patient log and has range in the 160-180 in the morning. In the evenings is higher 200-300.   post prandial excursions, nocturnal hypoglycemia, post meal hypoglycemia", bedtime/overnight hyperglycemia, fasting hypoglycemia,  fasting hyperglycemia, overcorrection, rebound and high variability. Says that there is quite a few times that she struggles with being very hungry but has  very high blood sugars -- in the 300s. Feeling very bad when it is this high.   Reports no, mild, moderate and severe symptoms of hypoglycemia. These include no events.   Denies polyuria, polyphagia.   Acknowledges polydipsia --  Craving "cold and wet" items and neuropathy on the bottom of the feet.  Denies chest pain, shortness of breath, edema, foot lesions or ulcers. Denies severe hypoglycemia or admission to hospital for DKA.   Current exercise: None  The patient's history was reviewed and updated as appropriate.  SLEEP  Having trouble sleeping at night. Complains of legs jerking right before falling asleep. Painful with cramps. Has been happening as long as she remembers. Gabapentin seemed to provide relief but is no longer providing relief.      Allergies  Allergen Reactions   Hydrocodone Nausea And Vomiting     Current outpatient prescriptions:    albuterol (VENTOLIN HFA) 108 (90 Base) MCG/ACT inhaler, Inhale 1 puff into the lungs 2 (two) times daily., Disp: , Rfl:    aspirin 81 MG tablet, Take 81 mg by mouth daily. Reported on 04/12/2015, Disp: , Rfl:    atorvastatin (LIPITOR) 40 MG tablet, Take 1 tablet (40 mg total) by mouth daily at 6 PM., Disp: 90 tablet, Rfl: 3   Calcium Carb-Cholecalciferol (CALCIUM + D3 PO), Take by mouth., Disp: , Rfl:    celecoxib (CELEBREX) 200 MG capsule, Take 200 mg by mouth 2 (two) times daily., Disp: , Rfl:    fenofibrate (TRICOR) 145 MG tablet, TAKE ONE TABLET BY MOUTH EVERY DAY. REPLACES GEMFIBRAZIL.,  Disp: 90 tablet, Rfl: 0   fluticasone (FLOVENT HFA) 110 MCG/ACT inhaler, Inhale 1 puff into the lungs 2 (two) times daily. , Disp: , Rfl:    fluticasone (VERAMYST) 27.5 MCG/SPRAY nasal spray, Place 1 spray into the nose daily., Disp: , Rfl:    gabapentin (NEURONTIN) 300 MG capsule, TAKE 2 CAPSULES BY MOUTH EVERY MORNING AND 3 CAPSULES AT BEDTIME, Disp: 150 capsule, Rfl: 0   Insulin Glargine (LANTUS) 100 UNIT/ML Solostar Pen, Inject  40 Units into the skin 2 (two) times daily., Disp: 15 mL, Rfl: 3   Liraglutide (VICTOZA) 18 MG/3ML SOPN, Inject 1.8 mg into the skin daily at 12 noon. At lunch time., Disp: , Rfl:    metFORMIN (GLUCOPHAGE) 1000 MG tablet, TAKE ONE TABLET BY MOUTH 2 TIMES A DAY, Disp: 173 tablet, Rfl: 0   metoprolol tartrate (LOPRESSOR) 25 MG tablet, Take 1 tablet (25 mg total) by mouth 2 (two) times daily., Disp: 180 tablet, Rfl: 3   ondansetron (ZOFRAN) 4 MG tablet, Take 1 tablet (4 mg total) by mouth every 8 (eight) hours as needed for nausea or vomiting. (Patient not taking: Reported on 04/12/2015), Disp: 20 tablet, Rfl: 0   oxyCODONE-acetaminophen (ROXICET) 5-325 MG tablet, Take 1 tablet by mouth every 4 (four) hours as needed for severe pain. (Patient not taking: Reported on 04/12/2015), Disp: 30 tablet, Rfl: 0   ranitidine (ZANTAC) 150 MG tablet, TAKE ONE TABLET BY MOUTH 2 TIMES A DAY., Disp: 60 tablet, Rfl: 0   sulfamethoxazole-trimethoprim (BACTRIM DS,SEPTRA DS) 800-160 MG tablet, Take 1 tablet by mouth 2 (two) times daily. (Patient not taking: Reported on 04/12/2015), Disp: 4 tablet, Rfl: 0   TRAVATAN Z 0.004 % SOLN ophthalmic solution, USE 1 DROP IN EACH EYE EACH EVENING., Disp: 15 mL, Rfl: 0  Social History   Social History   Marital Status: Divorced    Spouse Name: N/A   Number of Children: N/A   Years of Education: N/A   Social History Main Topics   Smoking status: Current Every Day Smoker -- 1.00 packs/day    Types: Cigarettes   Smokeless tobacco: Never Used   Alcohol Use: No   Drug Use: No   Sexual Activity: Not on file   Other Topics Concern   Not on file   Social History Narrative    Family History  Problem Relation Age of Onset   Adopted: Yes   Other      patient adopted    Review of Systems A 12 point review of systems was negative except for pertinent items noted in the HPI.   Objective:     Wt Readings from Last 3 Encounters:  04/12/15 242 lb (109.77  kg)  01/25/15 232 lb (105.235 kg)  03/09/15 230 lb (104.327 kg)   There were no vitals taken for this visit.                                            Lab Review  Lab Results  Component Value Date   HGBA1C 9.7* 07/12/2015    No components found for: A1C GLUCOSE (mg/dL)  Date Value  16/10/9602 139*   GLUCOSE, BLD (mg/dL)  Date Value  54/09/8117 204*  01/07/2015 167*  11/23/2014 186*   CO2 (mmol/L)  Date Value  02/25/2015 24  01/07/2015 26  11/23/2014 23   BUN (mg/dL)  Date Value  14/78/2956  26*  01/27/2015 28*  01/07/2015 20  11/23/2014 22*  11/23/2014 18  11/01/2014 26*   CREATININE (mg/dL)  Date Value  16/10/960401/26/2017 0.8   CREATININE, SER (mg/dL)  Date Value  54/09/811902/24/2017 0.96  01/07/2015 0.79  11/23/2014 0.96   No components found for: LDL,  LDLCALC,  LDLDIRECT Lab Results  Component Value Date   NA 136 02/25/2015   K 4.1 02/25/2015   CL 101 02/25/2015   CO2 24 02/25/2015   BUN 26* 02/25/2015   CREATININE 0.96 02/25/2015   GFRAA >60 02/25/2015   GFRNONAA >60 02/25/2015   GLU 125 01/27/2015   CALCIUM 9.7 02/25/2015   ALBUMIN 4.0 11/23/2014     DIABETES MELLITUS RESULTS: Lab Results  Component Value Date   HGBA1C 9.7* 07/12/2015   HGBA1C 7.3 01/27/2015   Lab Results  Component Value Date   LDLCALC 119 01/27/2015   CREATININE 0.96 02/25/2015   Lab Results  Component Value Date   CHOL 201* 01/27/2015   Lab Results  Component Value Date   LDLCALC 119 01/27/2015   No components found for: CHOLLLDLDIRECT No components found for: MICROALB/CR Lab Results  Component Value Date   GFRAA >60 02/25/2015   GFRNONAA >60 02/25/2015

## 2015-07-25 ENCOUNTER — Telehealth: Payer: Self-pay

## 2015-07-25 NOTE — Telephone Encounter (Signed)
Left message on pt vm. 

## 2015-08-02 ENCOUNTER — Ambulatory Visit: Payer: Self-pay

## 2015-08-10 ENCOUNTER — Other Ambulatory Visit: Payer: Self-pay | Admitting: Urology

## 2015-08-18 ENCOUNTER — Ambulatory Visit: Payer: Self-pay | Admitting: Family Medicine

## 2015-08-18 VITALS — BP 148/75 | HR 74 | Wt 245.0 lb

## 2015-08-18 DIAGNOSIS — E1122 Type 2 diabetes mellitus with diabetic chronic kidney disease: Secondary | ICD-10-CM

## 2015-08-18 DIAGNOSIS — K219 Gastro-esophageal reflux disease without esophagitis: Secondary | ICD-10-CM | POA: Insufficient documentation

## 2015-08-18 DIAGNOSIS — L723 Sebaceous cyst: Secondary | ICD-10-CM

## 2015-08-18 DIAGNOSIS — B354 Tinea corporis: Secondary | ICD-10-CM

## 2015-08-18 DIAGNOSIS — E1142 Type 2 diabetes mellitus with diabetic polyneuropathy: Secondary | ICD-10-CM

## 2015-08-18 DIAGNOSIS — Z794 Long term (current) use of insulin: Secondary | ICD-10-CM

## 2015-08-18 LAB — GLUCOSE, POCT (MANUAL RESULT ENTRY): POC Glucose: 193 mg/dl — AB (ref 70–99)

## 2015-08-18 MED ORDER — MICONAZOLE NITRATE 2 % EX CREA: 1.0000 "application " | TOPICAL_CREAM | Freq: Two times a day (BID) | CUTANEOUS | 0 refills | Status: AC

## 2015-08-18 MED ORDER — RANITIDINE HCL 150 MG PO TABS: ORAL_TABLET | ORAL | 6 refills | Status: AC

## 2015-08-18 MED ORDER — ASPIRIN 81 MG PO TABS: 81.0000 mg | ORAL_TABLET | Freq: Every day | ORAL | 3 refills | Status: AC

## 2015-08-18 MED ORDER — GABAPENTIN 300 MG PO CAPS: ORAL_CAPSULE | ORAL | 6 refills | Status: AC

## 2015-08-18 MED ORDER — OMEPRAZOLE 20 MG PO CPDR: 20.0000 mg | DELAYED_RELEASE_CAPSULE | Freq: Every day | ORAL | 0 refills | Status: AC

## 2015-08-18 NOTE — Assessment & Plan Note (Signed)
Not doing well on her zantac. Will start omeprazole. Recheck at follow up.

## 2015-08-18 NOTE — Assessment & Plan Note (Signed)
To see endocrine in October. Continue to monitor.

## 2015-08-18 NOTE — Progress Notes (Signed)
BP (!) 148/75   Pulse 74   Wt 245 lb (111.1 kg)   BMI 42.05 kg/m    Subjective:    Patient ID: Rose Luna J Ehrman, female    DOB: 07/12/1955, 60 y.o.   MRN: [id number]  HPI: Rose CowingCheri J Baucom is a 60 y.o. female  Chief Complaint  Patient presents with  . Rash   RASH Duration:  1 month  Location: at her bra line on the L  Itching: no Burning: no Redness: yes Oozing: no Scaling: no Blisters: no Painful: no Fevers: no Change in detergents/soaps/personal care products: yes- gone gentler  Recent illness: no Recent travel:no History of same: no Context: stable Alleviating factors: nothing Treatments attempted:lotion/moisturizer Shortness of breath: no  Throat/tongue swelling: no Myalgias/arthralgias: no  Needs a refill on her gabapentin and her zantac.  GERD GERD control status: uncontrolled  Satisfied with current treatment? no Heartburn frequency: daily Medication side effects: no  Medication compliance: better Duration: hours Nature: burning Location: substernal Heartburn duration: chronic Alleviatiating factors:  nothing Aggravating factors: nothing Dysphagia: no Odynophagia:  no Hematemesis: no Blood in stool: no EGD: no   Relevant past medical, surgical, family and social history reviewed and updated as indicated. Interim medical history since our last visit reviewed. Allergies and medications reviewed and updated.  Review of Systems  Constitutional: Negative.   Respiratory: Negative.   Cardiovascular: Negative.   Gastrointestinal: Negative for abdominal distention, abdominal pain, anal bleeding, blood in stool, constipation, diarrhea, nausea, rectal pain and vomiting.       + Heart burn  Skin: Positive for rash. Negative for color change, pallor and wound.    Per HPI unless specifically indicated above     Objective:    BP (!) 148/75   Pulse 74   Wt 245 lb (111.1 kg)   BMI 42.05 kg/m   Wt Readings from Last 3 Encounters:  08/18/15 245 lb  (111.1 kg)  07/19/15 236 lb (107 kg)  04/12/15 242 lb (109.8 kg)    Physical Exam  Constitutional: She is oriented to person, place, and time. She appears well-developed and well-nourished. No distress.  HENT:  Head: Normocephalic and atraumatic.  Right Ear: Hearing normal.  Left Ear: Hearing normal.  Nose: Nose normal.  Eyes: Conjunctivae and lids are normal. Right eye exhibits no discharge. Left eye exhibits no discharge. No scleral icterus.  Cardiovascular: Normal rate, regular rhythm, normal heart sounds and intact distal pulses.  Exam reveals no gallop and no friction rub.   No murmur heard. Pulmonary/Chest: Effort normal and breath sounds normal. No respiratory distress. She has no wheezes. She has no rales. She exhibits no tenderness.  Musculoskeletal: Normal range of motion.  Neurological: She is alert and oriented to person, place, and time.  Skin: Skin is warm, dry and intact. Rash noted. No erythema. No pallor.  Tinea corporis on L side of her back, sebaceous cyst on R shoulder blade- no redness, no sign of infection.    Psychiatric: She has a normal mood and affect. Her speech is normal and behavior is normal. Judgment and thought content normal. Cognition and memory are normal.  Vitals reviewed.   Results for orders placed or performed in visit on 08/18/15  POCT Glucose (CBG)  Result Value Ref Range   POC Glucose 193 (A) 70 - 99 mg/dl      Assessment & Plan:   Problem List Items Addressed This Visit      Digestive   GERD (gastroesophageal reflux disease)  Not doing well on her zantac. Will start omeprazole. Recheck at follow up.       Relevant Medications   ranitidine (ZANTAC) 150 MG tablet   omeprazole (PRILOSEC) 20 MG capsule     Endocrine   Type 2 diabetes mellitus (HCC)   Relevant Medications   aspirin 81 MG tablet   Other Relevant Orders   POCT Glucose (CBG) (Completed)     Nervous and Auditory   Diabetic peripheral neuropathy associated with type  2 diabetes mellitus (HCC) - Primary    To see endocrine in October. Continue to monitor.       Relevant Medications   gabapentin (NEURONTIN) 300 MG capsule   aspirin 81 MG tablet    Other Visit Diagnoses    Tinea corporis       Will treat with miconazole. Call if not getting better or getting worse.    Relevant Medications   miconazole (MICOTIN) 2 % cream   Sebaceous cyst       Reassured patient. No infection. Continue to monitor.        Follow up plan: Return 2 months, for DM visit and recheck GERD.

## 2015-08-18 NOTE — Patient Instructions (Addendum)

## 2015-10-10 ENCOUNTER — Emergency Department
Admission: EM | Admit: 2015-10-10 | Discharge: 2015-10-10 | Disposition: A | Discharge status: Home / Self Care | Payer: Self-pay | Attending: Emergency Medicine | Admitting: Emergency Medicine

## 2015-10-10 ENCOUNTER — Emergency Department: Payer: Self-pay

## 2015-10-10 ENCOUNTER — Encounter: Payer: Self-pay | Admitting: Emergency Medicine

## 2015-10-10 DIAGNOSIS — Z7982 Long term (current) use of aspirin: Secondary | ICD-10-CM | POA: Insufficient documentation

## 2015-10-10 DIAGNOSIS — I10 Essential (primary) hypertension: Secondary | ICD-10-CM | POA: Insufficient documentation

## 2015-10-10 DIAGNOSIS — Z7984 Long term (current) use of oral hypoglycemic drugs: Secondary | ICD-10-CM | POA: Insufficient documentation

## 2015-10-10 DIAGNOSIS — Z79899 Other long term (current) drug therapy: Secondary | ICD-10-CM | POA: Insufficient documentation

## 2015-10-10 DIAGNOSIS — R1011 Right upper quadrant pain: Secondary | ICD-10-CM | POA: Insufficient documentation

## 2015-10-10 DIAGNOSIS — R0789 Other chest pain: Secondary | ICD-10-CM | POA: Insufficient documentation

## 2015-10-10 DIAGNOSIS — I2510 Atherosclerotic heart disease of native coronary artery without angina pectoris: Secondary | ICD-10-CM | POA: Insufficient documentation

## 2015-10-10 DIAGNOSIS — R05 Cough: Secondary | ICD-10-CM | POA: Insufficient documentation

## 2015-10-10 DIAGNOSIS — F17210 Nicotine dependence, cigarettes, uncomplicated: Secondary | ICD-10-CM | POA: Insufficient documentation

## 2015-10-10 DIAGNOSIS — E119 Type 2 diabetes mellitus without complications: Secondary | ICD-10-CM | POA: Insufficient documentation

## 2015-10-10 DIAGNOSIS — J45909 Unspecified asthma, uncomplicated: Secondary | ICD-10-CM | POA: Insufficient documentation

## 2015-10-10 LAB — BASIC METABOLIC PANEL
ANION GAP: 7 (ref 5–15)
BUN: 23 mg/dL — AB (ref 6–20)
CHLORIDE: 103 mmol/L (ref 101–111)
CO2: 29 mmol/L (ref 22–32)
Calcium: 9.7 mg/dL (ref 8.9–10.3)
Creatinine, Ser: 1.01 mg/dL — ABNORMAL HIGH (ref 0.44–1.00)
GFR calc Af Amer: >60 mL/min (ref >60)
GFR calc non Af Amer: >60 mL/min (ref >60)
Glucose, Bld: 269 mg/dL — ABNORMAL HIGH (ref 65–99)
POTASSIUM: 5.4 mmol/L — AB (ref 3.5–5.1)
SODIUM: 139 mmol/L (ref 135–145)

## 2015-10-10 LAB — CBC WITH DIFFERENTIAL/PLATELET
BASOS ABS: 0.1 10*3/uL (ref 0–0.1)
Basophils Relative: 1 %
EOS PCT: 4 %
Eosinophils Absolute: 0.5 10*3/uL (ref 0–0.7)
HEMATOCRIT: 39.7 % (ref 35.0–47.0)
HEMOGLOBIN: 13.7 g/dL (ref 12.0–16.0)
LYMPHS ABS: 3.5 10*3/uL (ref 1.0–3.6)
LYMPHS PCT: 31 %
MCH: 30.3 pg (ref 26.0–34.0)
MCHC: 34.5 g/dL (ref 32.0–36.0)
MCV: 88 fL (ref 80.0–100.0)
Monocytes Absolute: 0.9 10*3/uL (ref 0.2–0.9)
Monocytes Relative: 8 %
NEUTROS ABS: 6.1 10*3/uL (ref 1.4–6.5)
NEUTROS PCT: 56 %
Platelets: 226 10*3/uL (ref 150–440)
RBC: 4.52 MIL/uL (ref 3.80–5.20)
RDW: 14.7 % — ABNORMAL HIGH (ref 11.5–14.5)
WBC: 11.1 10*3/uL — AB (ref 3.6–11.0)

## 2015-10-10 LAB — LIPASE, BLOOD: Lipase: 15 U/L (ref 11–51)

## 2015-10-10 MED ORDER — NAPROXEN 500 MG PO TABS: 500.0000 mg | ORAL_TABLET | Freq: Two times a day (BID) | ORAL | 0 refills | Status: AC

## 2015-10-10 MED ORDER — SODIUM CHLORIDE 0.9 % IV BOLUS (SEPSIS): 1000.0000 mL | Freq: Once | INTRAVENOUS | Status: DC

## 2015-10-10 NOTE — ED Notes (Signed)
States she developed pain to right anterior/latera rib area a few days ago  Denies any recent injury  But has had to sleep on floor for about 5 days  Area is tender on palpation

## 2015-10-10 NOTE — ED Provider Notes (Signed)
[facility] Emergency Department Provider Note  ____________________________________________  Time seen: Approximately 9:09 AM  I have reviewed the triage vital signs and the nursing notes.   HISTORY  Chief Complaint Chest Pain (right side ribs)    HPI Rose Luna is a 60 y.o. female , NAD, presents to emergency department with several day history of right lower rib pain. Patient states pain is about the right anterior lower rib/upper abdominal area. States she slept on a floor over the last week at her daughter's home while she was visiting and anticipated the pain was from laying on a hard surface. States pain worsens with certain twisting movements as well as coughing. Pain does not radiate. Denies any significant chest congestion or cyclical cough. Has had no upper respiratory symptoms. No fevers or chills. No central chest pain or pressure. Denies specific abdominal pain. Has had no nausea, vomiting, diarrhea. No injury or trauma to the chest or abdomen. Has not noted any skin sores or open wounds. No bruising.   Past Medical History:  Diagnosis Date  . Acute cystitis 09/19/2014  . Asthma   . Atrial fibrillation (HCC)   . Diabetes mellitus without complication (HCC)   . Elevated erythrocyte sedimentation rate 09/22/2014  . GERD (gastroesophageal reflux disease)   . History of renal stent   . HLD (hyperlipidemia) 04/19/2009  . Hypertension   . Infection of urinary tract 09/19/2014  . Injury of kidney 09/19/2014  . Type 2 diabetes mellitus (HCC) 04/18/2009  . Uremia 09/19/2014    Patient Active Problem List   Diagnosis Date Noted  . GERD (gastroesophageal reflux disease) 08/18/2015  . Diabetic peripheral neuropathy associated with type 2 diabetes mellitus (HCC) 07/19/2015  . Obesity 07/19/2015  . Abscess of abdominal wall 03/02/2015  . Aortic atherosclerosis (HCC) 01/04/2015  . PAD (peripheral artery disease) (HCC) 01/04/2015  . CAD (coronary artery  disease) 01/04/2015  . Preop cardiovascular exam 01/04/2015  . Retained ureteral stent 11/03/2014  . Renal abscess 11/03/2014  . Elevated erythrocyte sedimentation rate 09/22/2014  . Acute cystitis 09/19/2014  . Injury of kidney 09/19/2014  . Hypomagnesemia 09/19/2014  . Below normal amount of sodium in the blood 09/19/2014  . Elevated WBC count 09/19/2014  . Uremia 09/19/2014  . Infection of urinary tract 09/19/2014  . Current tobacco use 12/05/2011  . Adiposity 05/08/2011  . HLD (hyperlipidemia) 04/19/2009  . Type 2 diabetes mellitus (HCC) 04/18/2009    Past Surgical History:  Procedure Laterality Date  . ABDOMINAL HYSTERECTOMY    . CESAREAN SECTION    . CYSTOSCOPY W/ RETROGRADES Left 02/16/2015   Procedure: CYSTOSCOPY WITH RETROGRADE PYELOGRAM;  Surgeon: Hildred Laser, MD;  Location: [facility] OR[facility];  Service: Urology;  Laterality: Left;  . CYSTOSCOPY W/ URETERAL STENT PLACEMENT Left 02/16/2015   Procedure: CYSTOSCOPY WITH STENT REPLACEMENT;  Surgeon: Hildred Laser, MD;  Location: [facility];  Service: Urology;  Laterality: Left;  . CYSTOSCOPY WITH STENT PLACEMENT Left 03/09/2015   Procedure: CYSTOSCOPY WITH STENT PLACEMENT/ EXCHANGE Larina Bras BASKETING;  Surgeon: Hildred Laser, MD;  Location: [facility];  Service: Urology;  Laterality: Left;  . HYSTEROTOMY    . URETEROSCOPY WITH HOLMIUM LASER LITHOTRIPSY Left 02/16/2015   Procedure: ATTEMPTED URETEROSCOPY ;  Surgeon: Hildred Laser, MD;  Location: [facility];  Service: Urology;  Laterality: Left;  . URETEROSCOPY WITH HOLMIUM LASER LITHOTRIPSY Left 03/09/2015   Procedure: URETEROSCOPY WITH HOLMIUM LASER LITHOTRIPSY;  Surgeon: Hildred Laser, MD;  Location: [facility];  Service:  Urology;  Laterality: Left;  . urethral stent      Prior to Admission medications   Medication Sig Start Date End Date Taking? Authorizing Provider  albuterol (VENTOLIN HFA) 108 (90 Base) MCG/ACT inhaler Inhale 1 puff into the lungs 2 (two) times daily.     Historical Provider, MD  amitriptyline (ELAVIL) 50 MG tablet Take 1 to 2 tabs at bedtime 07/19/15   Nehemiah SettleJoseph F Largay, PA  aspirin 81 MG tablet Take 1 tablet (81 mg total) by mouth daily. Reported on 07/19/2015 08/18/15   Megan P Johnson, DO  atorvastatin (LIPITOR) 40 MG tablet Take 1 tablet (40 mg total) by mouth daily at 6 PM. 03/24/15   Harle BattiestShannon A McGowan, PA-C  Calcium Carb-Cholecalciferol (CALCIUM + D3 PO) Take by mouth. Reported on 07/19/2015    Historical Provider, MD  celecoxib (CELEBREX) 200 MG capsule Take 200 mg by mouth 2 (two) times daily.    Historical Provider, MD  fenofibrate (TRICOR) 145 MG tablet TAKE ONE TABLET BY MOUTH EVERY DAY. REPLACES GEMFIBRAZIL. 07/11/15   Zachery Daueronna S Odem, FNP  fluticasone (FLOVENT HFA) 110 MCG/ACT inhaler Inhale 1 puff into the lungs 2 (two) times daily. Reported on 07/19/2015    Historical Provider, MD  fluticasone (VERAMYST) 27.5 MCG/SPRAY nasal spray Place 1 spray into the nose daily. Reported on 07/19/2015    Historical Provider, MD  gabapentin (NEURONTIN) 300 MG capsule TAKE 2 CAPSULES BY MOUTH EVERY MORNING AND 3 CAPSULES AT BEDTIME 08/18/15   Megan P Johnson, DO  Liraglutide (VICTOZA) 18 MG/3ML SOPN Inject 1.8 mg into the skin daily at 12 noon. At lunch time.    Historical Provider, MD  metFORMIN (GLUCOPHAGE) 1000 MG tablet TAKE ONE TABLET BY MOUTH 2 TIMES A DAY 08/13/15   Carollee HerterShannon A McGowan, PA-C  metoprolol tartrate (LOPRESSOR) 25 MG tablet Take 1 tablet (25 mg total) by mouth 2 (two) times daily. 04/19/15   Nehemiah SettleJoseph F Largay, PA  miconazole (MICOTIN) 2 % cream Apply 1 application topically 2 (two) times daily. 08/18/15   Megan P Johnson, DO  naproxen (NAPROSYN) 500 MG tablet Take 1 tablet (500 mg total) by mouth 2 (two) times daily with a meal. 10/10/15   Jami L Hagler, PA-C  omeprazole (PRILOSEC) 20 MG capsule Take 1 capsule (20 mg total) by mouth daily. 08/18/15   Megan P Johnson, DO  ranitidine (ZANTAC) 150 MG tablet TAKE ONE TABLET BY MOUTH 2 TIMES A DAY. 08/18/15    Megan P Johnson, DO  TRAVATAN Z 0.004 % SOLN ophthalmic solution [facility] 05/19/15   Harle BattiestShannon A McGowan, PA-C    Allergies Hydrocodone  Family History  Problem Relation Age of Onset  . Adopted: Yes  . Other      patient adopted    Social History Social History  Substance Use Topics  . Smoking status: Current Every Day Smoker    Packs/day: 1.00    Types: Cigarettes  . Smokeless tobacco: Never Used  . Alcohol use No     Review of Systems  Constitutional: No fever/chills ENT: No sore throat, Nasal congestion, sinus pressure. Cardiovascular: No chest pain. Respiratory: Positive occasional, dry cough but no chest congestion. No shortness of breath. No wheezing.  Gastrointestinal: Positive intermittent right upper quadrant abdominal pain.  No nausea, vomiting.  No diarrhea.   Genitourinary: Negative for dysuria, hematuria.  Musculoskeletal: Positive right, anterior lower rib pain. Negative for back, neck pain.  Skin: Negative for rash, redness, swelling, abnormal warmth,  bruising, skin sores. Neurological: Negative for numbness, weakness, tingling. 10-point ROS otherwise negative.  ____________________________________________   PHYSICAL EXAM:  VITAL SIGNS: ED Triage Vitals  Enc Vitals Group     BP 10/10/15 0845 129/89     Pulse Rate 10/10/15 0845 90     Resp 10/10/15 0845 18     Temp 10/10/15 0845 98.2 F (36.8 C)     Temp Source 10/10/15 0845 Oral     SpO2 10/10/15 0845 (!) 67 %     Weight 10/10/15 0848 245 lb (111.1 kg)     Height --      Head Circumference --      Peak Flow --      Pain Score 10/10/15 0848 10     Pain Loc --      Pain Edu? --      Excl. in GC? --      Constitutional: Alert and oriented. Well appearing and in no acute distress. Eyes: Conjunctivae are normal Without icterus or injection.   Head: Atraumatic. Neck: Supple with full range of motion. Hematological/Lymphatic/Immunilogical: No cervical  lymphadenopathy. Cardiovascular: Normal rate, regular rhythm. Normal S1 and S2.  Good peripheral circulation. Respiratory: Normal respiratory effort without tachypnea or retractions. Lungs CTABWith breath sounds noted in all lung fields. No wheeze, rhonchi, rales Gastrointestinal: Tenderness to palpation about the right upper quadrant radiating into the epigastric region. Right upper quadrant and epigastric region is soft without distention or guarding. The rest of the abdomen is soft and nontender without distention or guarding. No rebound or rigidity. Bowel sounds are grossly normal active in all quadrants. Abdomen is obese. Musculoskeletal: Tenderness to palpation about the right, anterior lower rib cage to deep palpation. No sternal tenderness to palpation.  Neurologic:  Normal speech and language. No gross focal neurologic deficits are appreciated.  Skin:  Skin is warm, dry and intact. No rash, redness, swelling, abnormal warmth, bruising, skin sores noted. Psychiatric: Mood and affect are normal. Speech and behavior are normal. Patient exhibits appropriate insight and judgement.   ____________________________________________   LABS (all labs ordered are listed, but only abnormal results are displayed)  Labs Reviewed  BASIC METABOLIC PANEL - Abnormal; Notable for the following:       Result Value   Potassium 5.4 (*)    Glucose, Bld 269 (*)    BUN 23 (*)    Creatinine, Ser 1.01 (*)    All other components within normal limits  CBC WITH DIFFERENTIAL/PLATELET - Abnormal; Notable for the following:    WBC 11.1 (*)    RDW 14.7 (*)    All other components within normal limits  LIPASE, BLOOD   ____________________________________________  EKG  None ____________________________________________  RADIOLOGY I, Ernestene Kiel Hagler, personally viewed and evaluated these images (plain radiographs) as part of my medical decision making, as well as reviewing the written report by the  radiologist.  Dg Ribs Unilateral W/chest Right  Result Date: 10/10/2015 CLINICAL DATA:  Right-sided pain for 4 days. No history of recent trauma EXAM: RIGHT RIBS AND CHEST - 3+ VIEW COMPARISON:  Chest radiograph February 08, 2015 FINDINGS: Frontal chest as well as oblique and cone-down lower rib images were obtained. Lungs are clear. Heart size and pulmonary vascularity are normal. No adenopathy. No pneumothorax or pleural effusion evident. No demonstrable rib fracture. IMPRESSION: No demonstrable rib fracture.  No edema or consolidation. Electronically Signed   By: Bretta Bang III M.D.   On: 10/10/2015 09:40   US Abdomen Limited Ruq  Result Date: 10/10/2015 CLINICAL DATA:  Right upper quadrant pain for 4 weeks EXAM: US ABDOMEN LIMITED - RIGHT UPPER QUADRANT COMPARISON:  CT scan 11/23/2014 FINDINGS: Gallbladder: No shadowing gallstones are noted within gallbladder. Probable gallbladder wall polyps the largest measures 5 x 4 mm. No sonographic Murphy's sign. Common bile duct: Diameter: 5 mm in diameter within normal limits. Liver: There is coarse increased echogenicity of the liver suspicious for fatty infiltration. No focal hepatic mass. Suboptimal exam due to patient's large body habitus. IMPRESSION: 1. No shadowing gallstones are noted within gallbladder. Probable gallbladder wall polyps the largest measures 5 x 4 mm. 2. Normal CBD. 3. Increased echogenicity of the liver suspicious for fatty infiltration. No focal hepatic mass. Electronically Signed   By: Natasha Mead M.D.   On: 10/10/2015 11:16    ____________________________________________    PROCEDURES  Procedure(s) performed: None   Procedures   Medications - No data to display   ____________________________________________   INITIAL IMPRESSION / ASSESSMENT AND PLAN / ED COURSE  Pertinent labs & imaging results that were available during my care of the patient were reviewed by me and considered in my medical decision making  (see chart for details).  Clinical Course  Comment By Time  Relayed all labs and imaging results to the patient and her husband at bedside. Discussed that her blood glucose was 269. Patient states she ate sausage, eggs, hasbrowns and toast prior to presenting to the emergency department. She states she takes her diabetic medications daily but does not adhere to a diabetic diet. Patient is to follow-up with her primary care provider if symptoms persist. Patient encouraged to follow a more strict diabetic diet to avoid elevations of sugar of this nature. Hope Pigeon, PA-C 10/09 1130    Patient's diagnosis is consistent with Right upper quadrant abdominal pain and right lower chest wall pain. Patient will be discharged home with prescriptions for naproxen to take as directed. Patient is to apply ice to the affected area 20 minutes 3-4 times daily as needed. Patient is to follow up with her primary care provider if symptoms persist past this treatment course. Patient is given ED precautions to return to the ED for any worsening or new symptoms.    ____________________________________________  FINAL CLINICAL IMPRESSION(S) / ED DIAGNOSES  Final diagnoses:  RUQ abdominal pain  Chest wall pain      NEW MEDICATIONS STARTED DURING THIS VISIT:  Discharge Medication List as of 10/10/2015 11:30 AM    START taking these medications   Details  naproxen (NAPROSYN) 500 MG tablet Take 1 tablet (500 mg total) by mouth 2 (two) times daily with a meal., Starting Mon 10/10/2015, Print             Ernestene Kiel [place], PA-C 10/10/15 1419    Jeanmarie Plant, MD 10/10/15 1511

## 2015-10-10 NOTE — ED Triage Notes (Addendum)
Pt with right side rib pain worse with movement, coughing.

## 2015-10-18 ENCOUNTER — Ambulatory Visit: Payer: Self-pay

## 2015-10-25 ENCOUNTER — Other Ambulatory Visit: Payer: Self-pay | Admitting: Urology

## 2015-10-25 MED ORDER — INSULIN GLARGINE 100 UNIT/ML SOLOSTAR PEN: 50.0000 [IU] | PEN_INJECTOR | Freq: Every day | SUBCUTANEOUS | 11 refills | Status: DC

## 2015-10-25 NOTE — Progress Notes (Signed)
There was no dose listed for the Lantus Solostar.  She was on 50 U of Lantus bid.

## 2015-10-26 NOTE — Progress Notes (Signed)
Keri, Please see Shannon's note.

## 2015-11-03 ENCOUNTER — Ambulatory Visit: Payer: Self-pay | Admitting: Ophthalmology

## 2015-11-03 ENCOUNTER — Other Ambulatory Visit: Payer: Self-pay

## 2015-11-03 DIAGNOSIS — Z23 Encounter for immunization: Secondary | ICD-10-CM

## 2015-11-03 NOTE — Progress Notes (Unsigned)
Pt here for a flu vaccine. Given IM in right deltoid. PT tolerated well. VIS given to pt.

## 2015-11-07 ENCOUNTER — Other Ambulatory Visit: Payer: Self-pay | Admitting: Urology

## 2015-11-15 ENCOUNTER — Other Ambulatory Visit: Payer: Self-pay | Admitting: Adult Health Nurse Practitioner

## 2015-11-15 MED ORDER — INSULIN GLARGINE 100 UNIT/ML SOLOSTAR PEN: 50.0000 [IU] | PEN_INJECTOR | Freq: Two times a day (BID) | SUBCUTANEOUS | 11 refills | Status: AC

## 2015-11-22 ENCOUNTER — Ambulatory Visit: Payer: Self-pay

## 2015-12-02 DEATH — deceased

## 2016-02-09 ENCOUNTER — Ambulatory Visit: Payer: Self-pay | Admitting: Ophthalmology

## 2016-05-08 ENCOUNTER — Telehealth: Payer: Self-pay | Admitting: Cardiovascular Disease

## 2016-05-08 NOTE — Telephone Encounter (Signed)
3 attempts to schedule 12 month f/u per checkout 01/04/2015 from recall list.  lmov deleting recall.

## 2016-07-10 IMAGING — CT CT ABD-PELV W/ CM
1 of 3 series · 14 of 32 positions shown, 19 images · IV contrast (omnipaque)
Comparison: 11/03/2014

CLINICAL DATA: Low back pain radiating into the left flank. Known
kidney stone and waiting surgery.

EXAM:
CT ABDOMEN AND PELVIS WITH CONTRAST
TECHNIQUE: Multidetector CT imaging of the abdomen and pelvis was performed
using the standard protocol following bolus administration of
intravenous contrast.
CONTRAST:  100mL OMNIPAQUE IOHEXOL 350 MG/ML SOLN

[Series 2: routine abd pel with · axial · 0.94mm/px · z∈[-906,-456]mm · 14 of 102 slices shown, 19 images]
[im 6/102  soft-tissue]
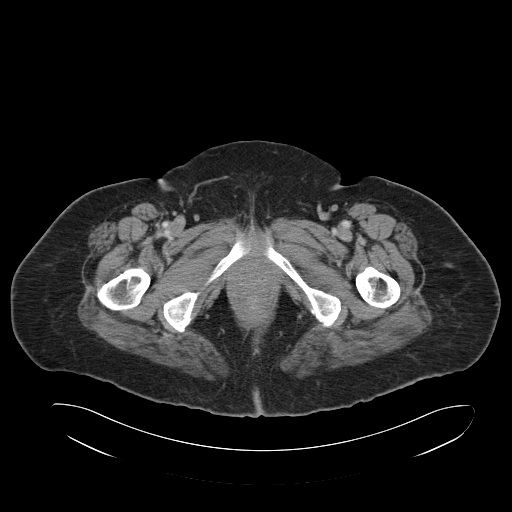
[im 6/102  bone]
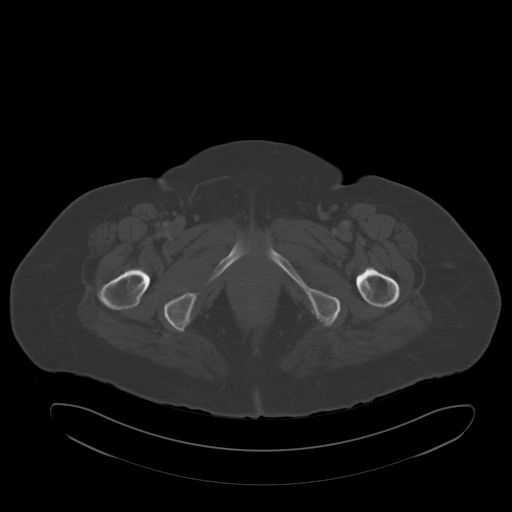
[im 12/102  soft-tissue]
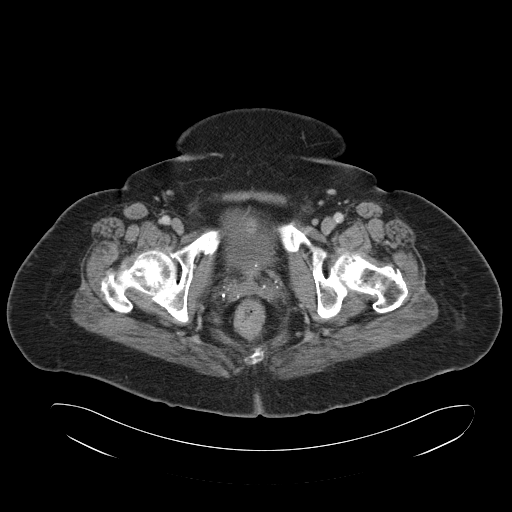
[im 23/102  soft-tissue]
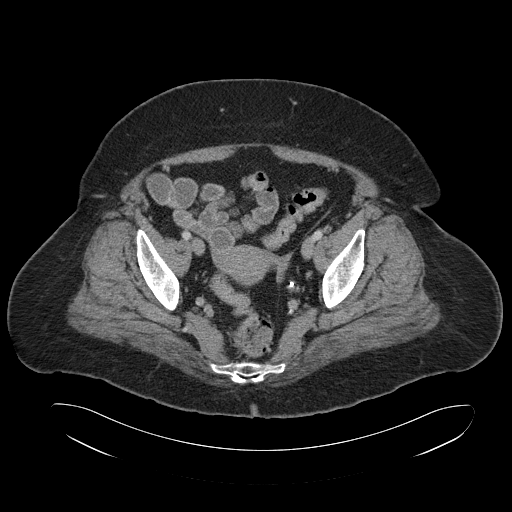
[im 29/102  soft-tissue]
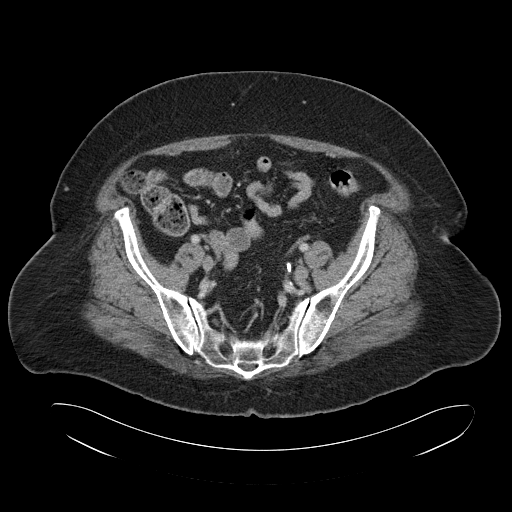
[im 34/102  soft-tissue]
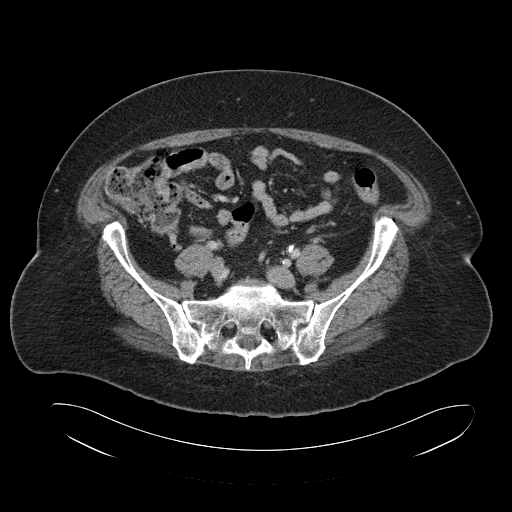
[im 45/102  soft-tissue]
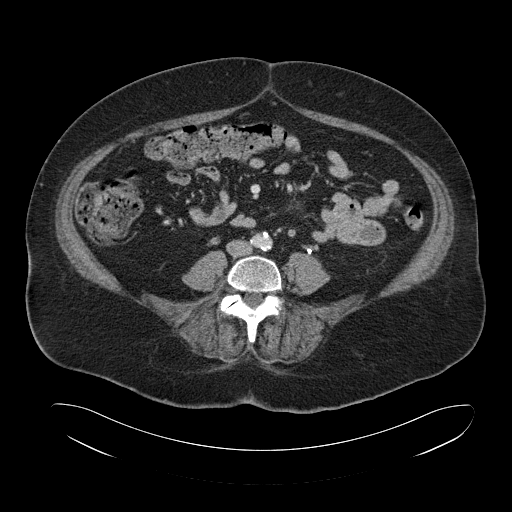
[im 51/102  soft-tissue]
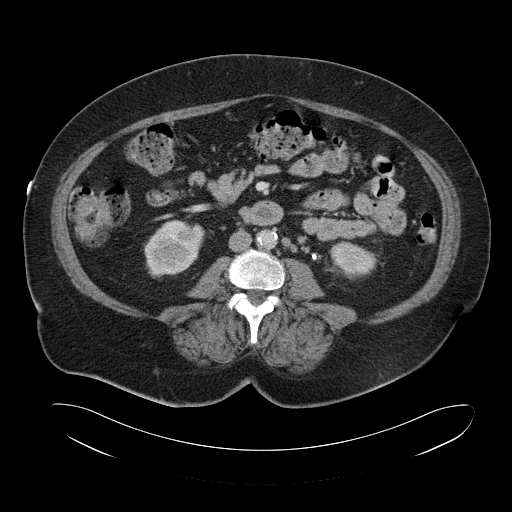
[im 57/102  soft-tissue]
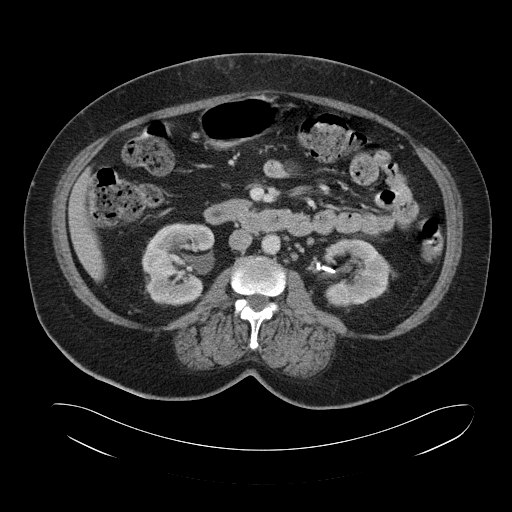
[im 68/102  soft-tissue]
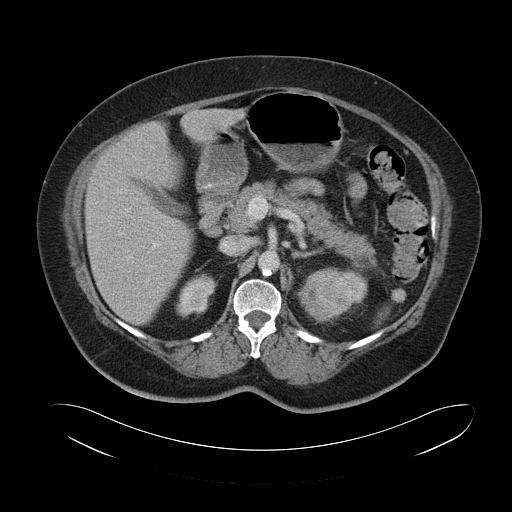
[im 68/102  bone]
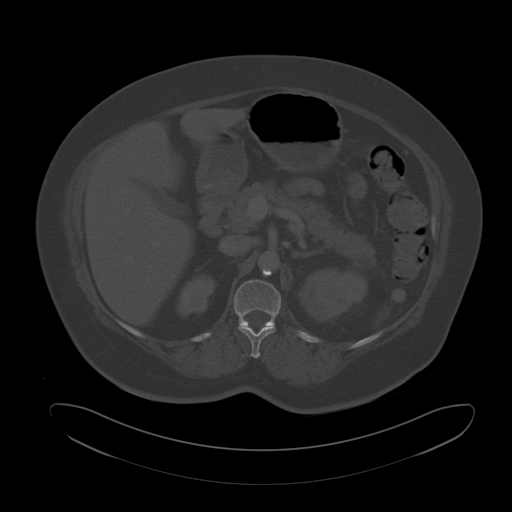
[im 73/102  soft-tissue]
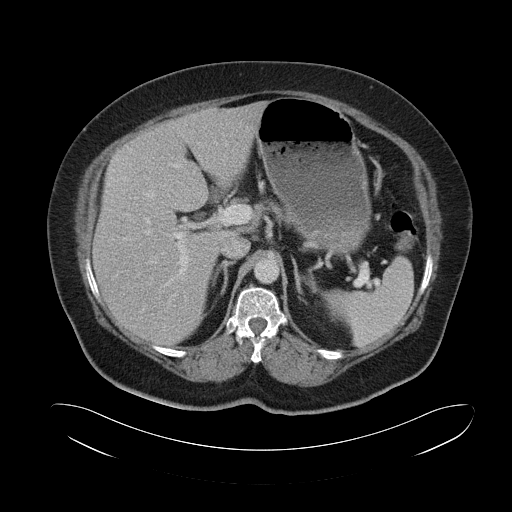
[im 79/102  soft-tissue]
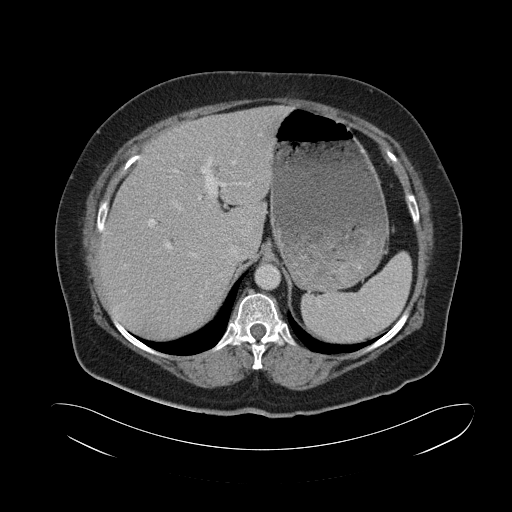
[im 79/102  lung]
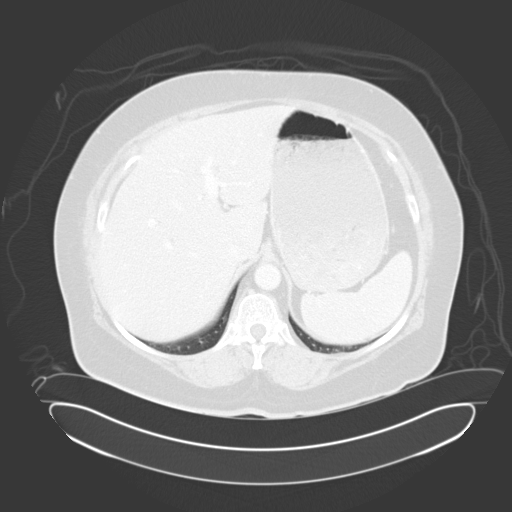
[im 85/102  lung]
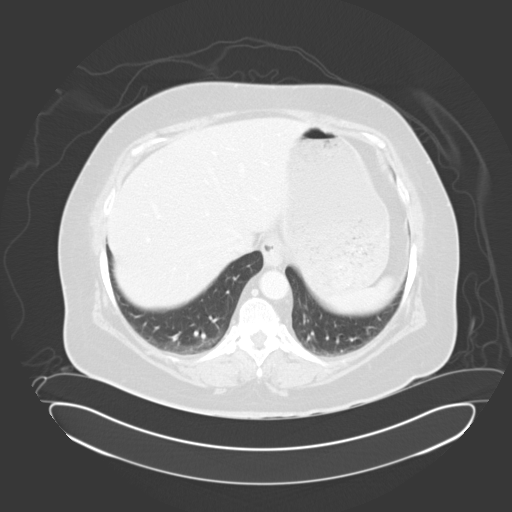
[im 90/102  soft-tissue]
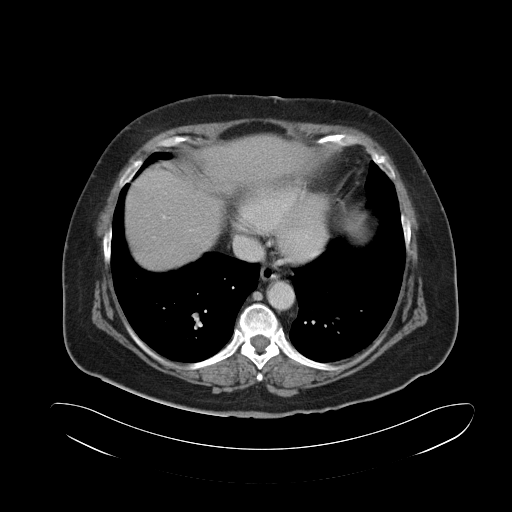
[im 90/102  lung]
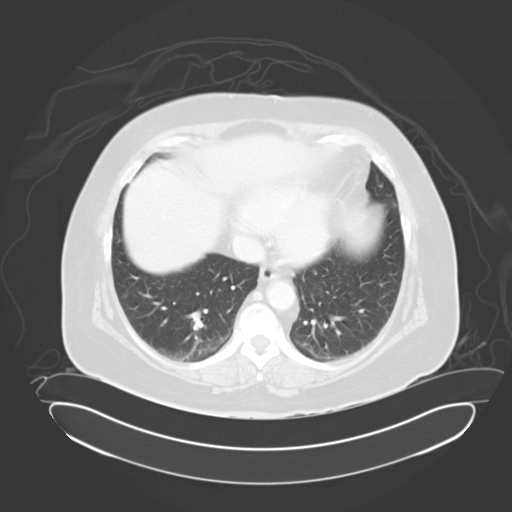
[im 96/102  soft-tissue]
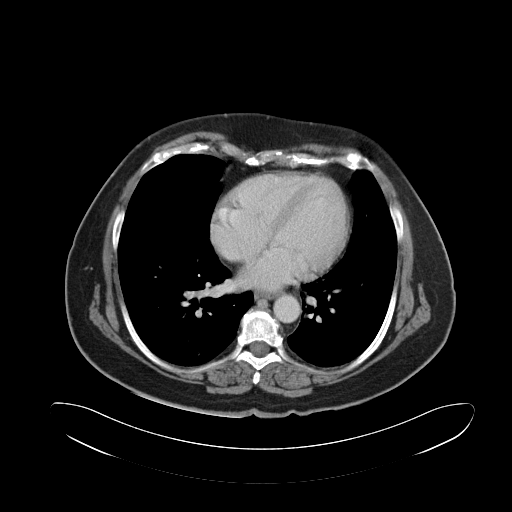
[im 96/102  lung]
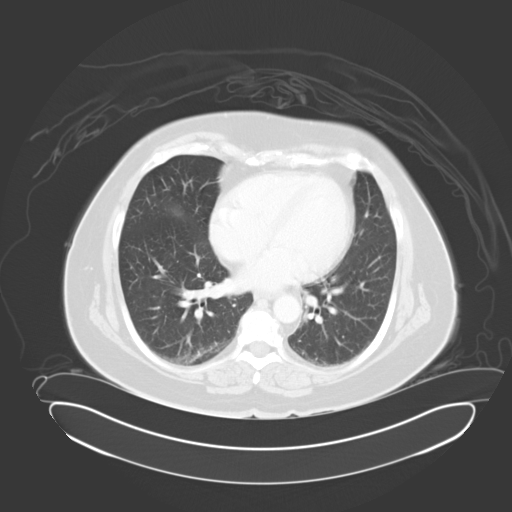

[14 of 32 positions shown; findings below may reference images not displayed]

FINDINGS: Dependent atelectasis in the lung bases. Mild emphysematous changes.

Liver, spleen, gallbladder, pancreas, adrenal glands, abdominal
aorta, inferior vena cava, and retroperitoneal lymph nodes are
unremarkable.

There is a stone in the lower pole left kidney measuring 7.2 mm.
Left ureteral stent remains in place with distal pigtail in the
bladder and proximal pigtail in the left renal pelvis. Renal
collecting system is decompressed. There is some stranding around
the left kidney with heterogeneous nephrogram in the upper pole and
subcapsular fluid in the upper pole. Stranding and subcapsular
changes are decreasing since previous study. Appearance is
consistent with improving focal pyelonephritis. Prominent but non
pathologically enlarged lymph nodes in the right retroperitoneum are
likely reactive. Cyst in the right kidney. No hydronephrosis or
hydroureter on the right.

Stomach, small bowel, and colon are not abnormally distended. Stool
fills the colon. No free air or free fluid in the abdomen. Abdominal
wall musculature appears intact.

Pelvis: Bladder wall is not thickened. Uterus and ovaries are not
enlarged. No free or loculated pelvic fluid collections. No pelvic
mass or lymphadenopathy. Appendix is normal. Mild degenerative
changes in the spine. No destructive bone lesions.
IMPRESSION: Stone in the lower pole left kidney. Changes of focal pyelonephritis
in the upper pole left kidney demonstrating improvement since prior
study. Reactive lymph nodes. Left ureteral stent remains in place.
Renal collecting system is decompressed.

## 2018-11-11 ENCOUNTER — Other Ambulatory Visit: Payer: Self-pay | Admitting: Ophthalmology
